# Patient Record
Sex: Female | Born: 1970
Health system: Southern US, Community
[De-identification: ages and names within clinical notes are randomized; demographics above are authoritative.]

## PROBLEM LIST (undated history)

## (undated) DIAGNOSIS — E079 Disorder of thyroid, unspecified: Secondary | ICD-10-CM

## (undated) DIAGNOSIS — F419 Anxiety disorder, unspecified: Secondary | ICD-10-CM

## (undated) DIAGNOSIS — R011 Cardiac murmur, unspecified: Secondary | ICD-10-CM

## (undated) HISTORY — DX: Anxiety disorder, unspecified: F41.9

## (undated) HISTORY — PX: EYE SURGERY: SHX253

## (undated) HISTORY — PX: TUBAL LIGATION: SHX77

## (undated) HISTORY — DX: Disorder of thyroid, unspecified: E07.9

## (undated) HISTORY — PX: WISDOM TOOTH EXTRACTION: SHX21

## (undated) HISTORY — DX: Cardiac murmur, unspecified: R01.1

---

## 2016-02-11 LAB — CBC AND DIFFERENTIAL
HCT: 38 (ref 36–46)
Hemoglobin: 12.9 (ref 12.0–16.0)
Neutrophils Absolute: 4238
PLATELETS: 363 (ref 150–399)
WBC: 6.5

## 2016-02-11 LAB — LIPID PANEL
Cholesterol: 171 (ref 0–200)
HDL: 63 (ref 35–70)
LDL Cholesterol: 93
TRIGLYCERIDES: 61 (ref 40–160)

## 2016-02-11 LAB — BASIC METABOLIC PANEL
BUN: 14 (ref 4–21)
CREATININE: 0.6 (ref 0.5–1.1)
Glucose: 75
POTASSIUM: 4.3 (ref 3.4–5.3)
Sodium: 138 (ref 137–147)

## 2016-02-11 LAB — TSH: TSH: 0.02 — AB (ref 0.41–5.90)

## 2016-02-11 LAB — HEPATIC FUNCTION PANEL
ALK PHOS: 64 (ref 25–125)
ALT: 10 (ref 7–35)
AST: 12 — AB (ref 13–35)
BILIRUBIN, TOTAL: 0.5

## 2016-02-11 LAB — FOLLICLE STIMULATING HORMONE: FSH: 7.9

## 2016-07-07 LAB — HEPATIC FUNCTION PANEL: BILIRUBIN, TOTAL: 0.5

## 2016-07-14 LAB — HEPATIC FUNCTION PANEL
ALK PHOS: 73 (ref 25–125)
ALT: 9 (ref 7–35)
AST: 13 (ref 13–35)

## 2016-07-14 LAB — BASIC METABOLIC PANEL
BUN: 10 (ref 4–21)
Creatinine: 0 — AB (ref <=1.1)
POTASSIUM: 3.9 (ref 3.4–5.3)
Sodium: 140 (ref 137–147)

## 2016-07-14 LAB — TSH: TSH: 0.2 — AB (ref <=5.90)

## 2016-07-14 LAB — CBC AND DIFFERENTIAL
HCT: 40 (ref 36–46)
HEMOGLOBIN: 13.2 (ref 12.0–16.0)
PLATELETS: 301 (ref 150–399)
WBC: 9.8

## 2016-08-03 LAB — LIPID PANEL
CHOLESTEROL: 211 — AB (ref 0–200)
HDL: 73 — AB (ref 35–70)
LDL CALC: 119
LDL/HDL RATIO: 1.8
TRIGLYCERIDES: 95 (ref 40–160)

## 2016-08-03 LAB — VITAMIN D 25 HYDROXY (VIT D DEFICIENCY, FRACTURES): Vit D, 25-Hydroxy: 88.8

## 2017-03-16 ENCOUNTER — Ambulatory Visit (INDEPENDENT_AMBULATORY_CARE_PROVIDER_SITE_OTHER): Payer: Self-pay | Admitting: Physician Assistant

## 2017-03-16 ENCOUNTER — Encounter: Payer: Self-pay | Admitting: Physician Assistant

## 2017-03-16 VITALS — BP 118/74 | HR 88 | Temp 98.5°F | Ht <= 58 in | Wt 106.2 lb

## 2017-03-16 DIAGNOSIS — J069 Acute upper respiratory infection, unspecified: Secondary | ICD-10-CM

## 2017-03-16 MED ORDER — PREDNISONE 20 MG PO TABS: 20.0000 mg | ORAL_TABLET | Freq: Two times a day (BID) | ORAL | 0 refills | Status: DC

## 2017-03-16 MED ORDER — DOXYCYCLINE HYCLATE 100 MG PO TABS
100.0000 mg | ORAL_TABLET | Freq: Two times a day (BID) | ORAL | 0 refills | Status: DC
Start: 2017-03-16 — End: 2017-06-22

## 2017-03-16 MED ORDER — PROMETHAZINE-CODEINE 6.25-10 MG/5ML PO SYRP: 5.0000 mL | ORAL_SOLUTION | Freq: Four times a day (QID) | ORAL | 0 refills | Status: DC | PRN

## 2017-03-16 MED ORDER — BENZONATATE 100 MG PO CAPS: 100.0000 mg | ORAL_CAPSULE | Freq: Two times a day (BID) | ORAL | 0 refills | Status: DC | PRN

## 2017-03-16 NOTE — Progress Notes (Signed)
Jessica Juarez is a 46 y.o. female here for a new problem.  History of Present Illness:   Chief Complaint  Patient presents with  . Establish Care  . Cough    x503mos  . congestion in chest    HPI   She reports that she has had a cough x 3 months. Was coughing and wheezing last night, and feels as though her symptoms are worsening. Has had a CXR in the past two years. Hx of walking PNA and bronchitis. Stress induced weight loss but no concerns. Does have increased gas and has had diarrhea since Sunday. Had subjective low-grade fever last night. Appetite has been poor over the past few days.  No recent travel, no lower leg pain, hx of blood clots. Has been drinking fluids well. Has been taking Mucinex and Robitussin without relief. 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 She notes that phenergan cough syrup and tessalon perles work well for her.  She also has a history of anxiety, depression, thyroid disease, heart murmur.   Past Medical History:  Diagnosis Date  . Anxiety   . Depression   . Heart murmur   . Thyroid disease      Social History   Social History  . Marital status: Married    Spouse name: N/A  . Number of children: N/A  . Years of education: N/A   Occupational History  . Not on file.   Social History Main Topics  . Smoking status: Never Smoker  . Smokeless tobacco: Never Used  . Alcohol use Yes  . Drug use: No  . Sexual activity: Yes   Other Topics Concern  . 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 CESAREAN SECTION    . EYE SURGERY    . TUBAL LIGATION    . WISDOM TOOTH EXTRACTION      Family History  Problem Relation Age of Onset  . Diabetes Mother   . Depression Mother   . Colon cancer Father   . Liver cancer Father   . Thyroid disease Brother   . Migraines Son   . Diabetes Maternal Grandmother   .  Colon cancer Paternal Grandfather     Allergies  Allergen Reactions  . Keflex [Cephalexin] Hives  . Azithromycin Palpitations    Current Medications:   Current Outpatient Prescriptions:  .  benzonatate (TESSALON) 100 MG capsule, Take 1 capsule (100 mg total) by mouth 2 (two) times daily as needed for cough., Disp: 20 capsule, Rfl: 0 .  doxycycline (VIBRA-TABS) 100 MG tablet, Take 1 tablet (100 mg total) by mouth 2 (two) times daily., Disp: 20 tablet, Rfl: 0 .  levothyroxine (SYNTHROID, LEVOTHROID) 75 MCG tablet, Take 75 mcg by mouth daily., Disp: , Rfl: 0 .  LORazepam (ATIVAN) 1 MG tablet, Take 1 tablet by mouth at bedtime., Disp: , Rfl: 0 .  predniSONE (DELTASONE) 20 MG tablet, Take 1 tablet (20 mg total) by mouth 2 (two) times daily with a meal., Disp: 10 tablet, Rfl: 0 .  promethazine-codeine (PHENERGAN WITH CODEINE) 6.25-10 MG/5ML syrup, Take 5 mLs by mouth every 6 (six) hours as needed for cough., Disp: 120 mL, Rfl: 0 .  sertraline (ZOLOFT) 50 MG tablet, Take 50 mg by mouth daily., Disp: , Rfl: 0   Review of Systems:  ROS  Negative unless otherwise specified per HPI.  Vitals:   Vitals:   03/16/17 1400  BP: 118/74  Pulse: 88  Temp: 98.5 F (36.9 C)  TempSrc: Oral  SpO2: 98%  Weight: 106 lb 4 oz (48.2 kg)  Height: 4' 9.75" (1.467 m)     Body mass index is 22.4 kg/m.  Physical Exam:   Physical Exam  Constitutional: She appears well-developed. She is cooperative.  Non-toxic appearance. She does not have a sickly appearance. She does not appear ill. No distress.  HENT:  Head: Normocephalic and atraumatic.  Right Ear: Tympanic membrane, external ear and ear canal normal. Tympanic membrane is not erythematous, not retracted and not bulging.  Left Ear: Tympanic membrane, external ear and ear canal normal. Tympanic membrane is not erythematous, not retracted and not bulging.  Nose: Mucosal edema and rhinorrhea present. Right sinus exhibits no maxillary sinus tenderness  and no frontal sinus tenderness. Left sinus exhibits no maxillary sinus tenderness and no frontal sinus tenderness.  Mouth/Throat: Uvula is midline. Posterior oropharyngeal erythema present. No posterior oropharyngeal edema. Tonsils are 2+ on the right. Tonsils are 2+ on the left. No tonsillar exudate.  Sinus tenderness endorsed at nasal bridge  Eyes: Conjunctivae and lids are normal.  Neck: Trachea normal.  Cardiovascular: Normal rate, regular rhythm, S1 normal, S2 normal and normal heart sounds.   Pulmonary/Chest: Effort normal and breath sounds normal. She has no decreased breath sounds. She has no wheezes. She has no rhonchi. She has no rales.  Good air movement throughout lung exam  Abdominal: Normal appearance and bowel sounds are normal. There is no tenderness. There is no rigidity, no rebound and no guarding.  Lymphadenopathy:    She has no cervical adenopathy.  Neurological: She is alert.  Skin: Skin is warm, dry and intact.  Psychiatric: She has a normal mood and affect. Her speech is normal and behavior is normal.  Nursing note and vitals reviewed.   Assessment and Plan:    Synthia was seen today for establish care, cough and congestion in chest.  Diagnoses and all orders for this visit:  Upper respiratory tract infection, unspecified type No red flags on exam. Patient is self-pay at this time, hoping to have insurance on Nov 1. Start daily antihistamine. Begin oral antibiotic and take prednisone with food. If symptoms worsen or persist, very low threshold to get CXR, she would like to defer today 2/2 insurance issues. Follow-up as needed.  Other orders -     doxycycline (VIBRA-TABS) 100 MG tablet; Take 1 tablet (100 mg total) by mouth 2 (two) times daily. -     predniSONE (DELTASONE) 20 MG tablet; Take 1 tablet (20 mg total) by mouth 2 (two) times daily with a meal. -     promethazine-codeine (PHENERGAN WITH CODEINE) 6.25-10 MG/5ML syrup; Take 5 mLs by mouth every 6 (six)  hours as needed for cough. -     benzonatate (TESSALON) 100 MG capsule; Take 1 capsule (100 mg total) by mouth 2 (two) times daily as needed for cough.    . Reviewed expectations re: course of current medical issues. . Discussed self-management of symptoms. . Outlined signs and symptoms indicating need for more acute intervention. . Patient verbalized understanding and all questions were answered. . See orders for this visit as documented in the electronic medical record. . Patient received an After-Visit Summary.  CMA or LPN served as scribe during this visit. History, Physical, and Plan performed by medical provider. Documentation and orders  reviewed and attested to.  Inda Coke, PA-C

## 2017-03-16 NOTE — Patient Instructions (Addendum)
It was great to see you!  Please start daily antihistamine (Zyrtec, Allegra, or Claritin) -- generic okay!  Push fluids.  Start oral antibiotic (doxycycline). Take 20 mg prednisone daily. Cough syrup as needed. Tessalon perles as needed. If cough medicines are too expensive at pharmacy, I recommend Delsym.  Follow-up with us if symptoms worsen or persist.

## 2017-06-06 ENCOUNTER — Telehealth: Payer: Self-pay | Admitting: Physician Assistant

## 2017-06-06 ENCOUNTER — Ambulatory Visit (INDEPENDENT_AMBULATORY_CARE_PROVIDER_SITE_OTHER): Payer: BLUE CROSS/BLUE SHIELD

## 2017-06-06 DIAGNOSIS — Z23 Encounter for immunization: Secondary | ICD-10-CM | POA: Diagnosis not present

## 2017-06-06 NOTE — Telephone Encounter (Signed)
Please contact patient to schedule a follow up appointment in order to get medication.

## 2017-06-06 NOTE — Progress Notes (Signed)
I have reviewed and agree with note, evaluation, plan.   Stephen Hunter, MD  

## 2017-06-06 NOTE — Telephone Encounter (Signed)
Patient would like to have blood work done before her appointment with Rinaldo CloudSam Worley in March. She also needs two refills, listed below. Patient would like to know if her lorazepam is given in a 30 day supply, or more or less, because it's a controlled substance. Please advise.   MEDICATION: Levothyroxine (Synthroid, Levothroid) 75 MCG tablet   [facility] [address] [place]   IS THIS A 90 DAY SUPPLY : allowed to have 90, but has been getting it in 30 day supplies because of a recent move  IS PATIENT OUT OF MEDICATION: no  IF NOT; HOW MUCH IS LEFT: enough to last for all of January  LAST APPOINTMENT DATE: @1 /01/2018  NEXT APPOINTMENT DATE:@3 /04/2018  OTHER COMMENTS:    **Let patient know to contact pharmacy at the end of the day to make sure medication is ready. **  ** Please notify patient to allow 48-72 hours to process**  **Encourage patient to contact the pharmacy for refills or they can request refills through [facility]**   MEDICATION: lorazepam (ativan) 1MG  tablet  [facility] [address], [place]  IS THIS A 90 DAY SUPPLY : no, 30 day  IS PATIENT OUT OF MEDICATION: no  IF NOT; HOW MUCH IS LEFT: enough for all of January  LAST APPOINTMENT DATE: @1 /01/2018  NEXT APPOINTMENT DATE:@3 /04/2018  OTHER COMMENTS:    **Let patient know to contact pharmacy at the end of the day to make sure medication is ready. **  ** Please notify patient to allow 48-72 hours to process**  **Encourage patient to contact the pharmacy for refills or they can [facility] through [facility]**

## 2017-06-06 NOTE — Telephone Encounter (Signed)
Please call pt and schedule an appointment to see another provider until Jessica Juarez is back for refills on medications and lab work. Jessica Juarez has never prescribed these medications for pt and there is no labs.

## 2017-06-06 NOTE — Progress Notes (Signed)
Fluvarix Quadrivalent, 0.5 mL given IM, left deltoid, mfg: [employer], lot#: [id number], exp: 11/25/2017, ndc: 14782-956-21: 58160-898-41, pt tolerated well.

## 2017-06-20 ENCOUNTER — Encounter: Payer: BLUE CROSS/BLUE SHIELD | Admitting: Family Medicine

## 2017-06-22 ENCOUNTER — Ambulatory Visit: Payer: BLUE CROSS/BLUE SHIELD | Admitting: Family Medicine

## 2017-06-22 ENCOUNTER — Encounter: Payer: Self-pay | Admitting: Family Medicine

## 2017-06-22 VITALS — BP 112/74 | HR 71 | Temp 98.3°F | Ht <= 58 in | Wt 107.6 lb

## 2017-06-22 DIAGNOSIS — E039 Hypothyroidism, unspecified: Secondary | ICD-10-CM

## 2017-06-22 DIAGNOSIS — F5102 Adjustment insomnia: Secondary | ICD-10-CM

## 2017-06-22 LAB — TSH: TSH: 0.06 u[IU]/mL — ABNORMAL LOW (ref 0.35–4.50)

## 2017-06-22 LAB — T4, FREE: Free T4: 1.25 ng/dL (ref 0.60–1.60)

## 2017-06-22 MED ORDER — LORAZEPAM 1 MG PO TABS: 1.0000 mg | ORAL_TABLET | Freq: Every day | ORAL | 0 refills | Status: DC

## 2017-06-22 NOTE — Progress Notes (Signed)
Jessica Juarez is a 47 y.o. female is here for follow up.  History of Present Illness:   Britt Bottom CMA acting as scribe for Dr. Earlene Plater.  HPI: Patient comes in today for a refill on her levothyroxine and ativan. She has not had thyroid labs done since June 2018. We will order today. Patient is tolerating meditations.   Health Maintenance Due  Topic Date Due  . HIV Screening  09/09/1983  . TETANUS/TDAP  09/09/1987  . PAP SMEAR  09/08/1989   No flowsheet data found. PMHx, SurgHx, SocialHx, FamHx, Medications, and Allergies were reviewed in the Visit Navigator and updated as appropriate.  There are no active problems to display for this patient.  Social History   Tobacco Use  . Smoking status: Never Smoker  . Smokeless tobacco: Never Used  Substance Use Topics  . Alcohol use: Yes  . Drug use: No   Current Medications and Allergies:   Current Outpatient Medications:  .  levothyroxine (SYNTHROID, LEVOTHROID) 75 MCG tablet, Take 75 mcg by mouth daily., Disp: , Rfl: 0 .  LORazepam (ATIVAN) 1 MG tablet, Take 1 tablet by mouth at bedtime., Disp: , Rfl: 0  Allergies  Allergen Reactions  . Keflex [Cephalexin] Hives  . Azithromycin Palpitations   Review of Systems   Pertinent items are noted in the HPI. Otherwise, ROS is negative.  Vitals:   Vitals:   06/22/17 0959  Pulse: 71  Temp: 98.3 F (36.8 C)  TempSrc: Oral  SpO2: 100%  Weight: 107 lb 9.6 oz (48.8 kg)  Height: 4' 9.75" (1.467 m)     Body mass index is 22.68 kg/m.  Physical Exam:   Physical Exam  Constitutional: She is oriented to person, place, and time. She appears well-developed and well-nourished. No distress.  HENT:  Head: Normocephalic and atraumatic.  Right Ear: External ear normal.  Left Ear: External ear normal.  Nose: Nose normal.  Mouth/Throat: Oropharynx is clear and moist.  Eyes: Conjunctivae and EOM are normal. Pupils are equal, round, and reactive to light.  Neck: Normal range of  motion. Neck supple. No thyromegaly present.  Cardiovascular: Normal rate, regular rhythm, normal heart sounds and intact distal pulses.  Pulmonary/Chest: Effort normal and breath sounds normal.  Abdominal: Soft. Bowel sounds are normal.  Musculoskeletal: Normal range of motion.  Lymphadenopathy:    She has no cervical adenopathy.  Neurological: She is alert and oriented to person, place, and time.  Skin: Skin is warm and dry. Capillary refill takes less than 2 seconds.  Psychiatric: She has a normal mood and affect. Her behavior is normal.  Nursing note and vitals reviewed.   Assessment and Plan:   1. Adjustment insomnia Patient has been on this medication for years. Database reviewed and without concern. Okay three month fill today, but let her know that her primary may not feel that this is the best regimen to continue in the future. She has not tried other medications. After discussion, she may try to decrease this medication to 1/2 over the next few months, with the intent to wean off.  - LORazepam (ATIVAN) 1 MG tablet; Take 1 tablet (1 mg total) by mouth at bedtime.  Dispense: 90 tablet; Refill: 0  2. Acquired hypothyroidism Endocrine ROS: negative for - breast changes, hair pattern changes, hot flashes, malaise/lethargy, mood swings, palpitations, skin changes, temperature intolerance or unexpected weight changes. Will check labs prior to sending in refill.  - TSH - T4, free   . Reviewed expectations re:  course of current medical issues. . Discussed self-management of symptoms. . Outlined signs and symptoms indicating need for more acute intervention. . Patient verbalized understanding and all questions were answered. Marland Kitchen. Health Maintenance issues including appropriate healthy diet, exercise, and smoking avoidance were discussed with patient. . See orders for this visit as documented in the electronic medical record. . Patient received an After Visit Summary.  CMA served as  Neurosurgeonscribe during this visit. History, Physical, and Plan performed by medical provider. The above documentation has been reviewed and is accurate and complete. Helane RimaErica Jaidyn Kuhl, D.O.   Helane RimaErica Humbert Morozov, DO [place] 06/23/2017

## 2017-06-25 MED ORDER — LEVOTHYROXINE SODIUM 50 MCG PO TABS
50.0000 ug | ORAL_TABLET | Freq: Every day | ORAL | 3 refills | Status: DC
Start: 2017-06-25 — End: 2018-07-02

## 2017-06-25 NOTE — Addendum Note (Signed)
Addended by: Helane RimaWALLACE, Marrion Finan R on: 06/25/2017 02:43 PM   Modules accepted: Orders

## 2017-08-07 ENCOUNTER — Encounter: Payer: Self-pay | Admitting: Physician Assistant

## 2017-08-07 ENCOUNTER — Encounter: Payer: BLUE CROSS/BLUE SHIELD | Admitting: Physician Assistant

## 2017-08-07 ENCOUNTER — Other Ambulatory Visit: Payer: Self-pay | Admitting: Physician Assistant

## 2017-08-07 ENCOUNTER — Telehealth: Payer: Self-pay | Admitting: Physician Assistant

## 2017-08-07 ENCOUNTER — Ambulatory Visit (INDEPENDENT_AMBULATORY_CARE_PROVIDER_SITE_OTHER): Payer: BLUE CROSS/BLUE SHIELD | Admitting: Physician Assistant

## 2017-08-07 VITALS — BP 110/70 | HR 79 | Temp 98.7°F | Ht <= 58 in | Wt 105.4 lb

## 2017-08-07 DIAGNOSIS — Z13220 Encounter for screening for lipoid disorders: Secondary | ICD-10-CM | POA: Diagnosis not present

## 2017-08-07 DIAGNOSIS — K5900 Constipation, unspecified: Secondary | ICD-10-CM | POA: Diagnosis not present

## 2017-08-07 DIAGNOSIS — E559 Vitamin D deficiency, unspecified: Secondary | ICD-10-CM | POA: Diagnosis not present

## 2017-08-07 DIAGNOSIS — F4321 Adjustment disorder with depressed mood: Secondary | ICD-10-CM | POA: Diagnosis not present

## 2017-08-07 DIAGNOSIS — M542 Cervicalgia: Secondary | ICD-10-CM | POA: Diagnosis not present

## 2017-08-07 DIAGNOSIS — F419 Anxiety disorder, unspecified: Secondary | ICD-10-CM | POA: Diagnosis not present

## 2017-08-07 DIAGNOSIS — N926 Irregular menstruation, unspecified: Secondary | ICD-10-CM

## 2017-08-07 DIAGNOSIS — B001 Herpesviral vesicular dermatitis: Secondary | ICD-10-CM | POA: Diagnosis not present

## 2017-08-07 DIAGNOSIS — E039 Hypothyroidism, unspecified: Secondary | ICD-10-CM

## 2017-08-07 DIAGNOSIS — Z0001 Encounter for general adult medical examination with abnormal findings: Secondary | ICD-10-CM | POA: Diagnosis not present

## 2017-08-07 LAB — CBC WITH DIFFERENTIAL/PLATELET
Basophils Absolute: 0 10*3/uL (ref 0.0–0.1)
Basophils Relative: 0.7 % (ref 0.0–3.0)
EOS PCT: 0.6 % (ref 0.0–5.0)
Eosinophils Absolute: 0 10*3/uL (ref 0.0–0.7)
HEMATOCRIT: 39.5 % (ref 36.0–46.0)
HEMOGLOBIN: 13.3 g/dL (ref 12.0–15.0)
LYMPHS PCT: 32.9 % (ref 12.0–46.0)
Lymphs Abs: 1.5 10*3/uL (ref 0.7–4.0)
MCHC: 33.8 g/dL (ref 30.0–36.0)
MCV: 90.4 fl (ref 78.0–100.0)
MONO ABS: 0.3 10*3/uL (ref 0.1–1.0)
MONOS PCT: 6.8 % (ref 3.0–12.0)
Neutro Abs: 2.7 10*3/uL (ref 1.4–7.7)
Neutrophils Relative %: 59 % (ref 43.0–77.0)
Platelets: 362 10*3/uL (ref 150.0–400.0)
RBC: 4.36 Mil/uL (ref 3.87–5.11)
RDW: 13.1 % (ref 11.5–15.5)
WBC: 4.6 10*3/uL (ref 4.0–10.5)

## 2017-08-07 LAB — LIPID PANEL
CHOL/HDL RATIO: 3
Cholesterol: 177 mg/dL (ref 0–200)
HDL: 63.5 mg/dL (ref >39.00)
LDL CALC: 99 mg/dL (ref 0–99)
NonHDL: 113.95
Triglycerides: 74 mg/dL (ref 0.0–149.0)
VLDL: 14.8 mg/dL (ref 0.0–40.0)

## 2017-08-07 LAB — VITAMIN D 25 HYDROXY (VIT D DEFICIENCY, FRACTURES): VITD: 32.83 ng/mL (ref 30.00–100.00)

## 2017-08-07 LAB — COMPREHENSIVE METABOLIC PANEL
ALBUMIN: 4.6 g/dL (ref 3.5–5.2)
ALT: 11 U/L (ref 0–35)
AST: 10 U/L (ref 0–37)
Alkaline Phosphatase: 55 U/L (ref 39–117)
BUN: 14 mg/dL (ref 6–23)
CHLORIDE: 103 meq/L (ref 96–112)
CO2: 29 meq/L (ref 19–32)
Calcium: 10.2 mg/dL (ref 8.4–10.5)
Creatinine, Ser: 0.65 mg/dL (ref 0.40–1.20)
GFR: 103.01 mL/min (ref >60.00)
Glucose, Bld: 89 mg/dL (ref 70–99)
POTASSIUM: 4.1 meq/L (ref 3.5–5.1)
SODIUM: 140 meq/L (ref 135–145)
Total Bilirubin: 0.6 mg/dL (ref 0.2–1.2)
Total Protein: 7.2 g/dL (ref 6.0–8.3)

## 2017-08-07 LAB — LUTEINIZING HORMONE: LH: 63.37 m[IU]/mL

## 2017-08-07 LAB — T4, FREE: FREE T4: 0.9 ng/dL (ref 0.60–1.60)

## 2017-08-07 LAB — FOLLICLE STIMULATING HORMONE: FSH: 115.7 m[IU]/mL

## 2017-08-07 LAB — TSH: TSH: 1.12 u[IU]/mL (ref 0.35–4.50)

## 2017-08-07 MED ORDER — CYCLOBENZAPRINE HCL 5 MG PO TABS: 2.5000 mg | ORAL_TABLET | Freq: Every day | ORAL | 0 refills | Status: DC

## 2017-08-07 MED ORDER — VALACYCLOVIR HCL 1 G PO TABS: ORAL_TABLET | ORAL | 2 refills | Status: DC

## 2017-08-07 NOTE — Assessment & Plan Note (Signed)
I'm going to recheck her labs today. Of note she recently was decreased from 75 g to 50 g in January. She is having some symptoms including constipation, depression, dry skin. Will adjust dosage based on lab results.

## 2017-08-07 NOTE — Assessment & Plan Note (Signed)
Recheck today. 

## 2017-08-07 NOTE — Telephone Encounter (Signed)
Copied from CRM [phone]. Topic: Inquiry >> Aug 07, 2017 11:13 AM Maia Pettiesrtiz, Kristie S wrote: Reason for CRM: pt called in requesting flexeril as discussed with provider. She said she may only take it on the weekends. Please call to notify pt. [id number] [address]- [place], [place] - [address] [phone] (Phone) [phone] (Fax)

## 2017-08-07 NOTE — Telephone Encounter (Signed)
Sent!

## 2017-08-07 NOTE — Assessment & Plan Note (Signed)
GAD-7 is 12 today. A lot of her anxiety is from situational stress from her recent move. She is reluctant to restart Zoloft. Continue Ativan when necessary. She is agreeable to reaching out to Erasmo DownerLisa Florez, our Child psychotherapistsocial worker, for counseling. I would like for follow-up in 2 weeks.

## 2017-08-07 NOTE — Assessment & Plan Note (Signed)
Uncontrolled. He is getting worse with time. She would like to go ahead and establish with gastroenterology as she is anticipating needing a colonoscopy soon, and would like to possibly discuss prescription medications for her constipation.

## 2017-08-07 NOTE — Assessment & Plan Note (Signed)
Currently asymptomatic. I have sent in Valtrex for her to take as needed for potential future outbreaks.

## 2017-08-07 NOTE — Addendum Note (Signed)
Addended by: Haynes BastWORLEY, Deven Furia J on: 08/07/2017 12:09 PM   Modules accepted: Orders

## 2017-08-07 NOTE — Telephone Encounter (Signed)
See note

## 2017-08-07 NOTE — Telephone Encounter (Signed)
Please see message, pt wanting Flexeril as discussed at visit.

## 2017-08-07 NOTE — Progress Notes (Addendum)
I acted as a Neurosurgeon for [employer], PA-C [employer], LPN   Subjective:    Jessica Juarez is a 47 y.o. female and is here for a comprehensive physical exam.   HPI  Health Maintenance Due  Topic Date Due  . HIV Screening  09/09/1983  . MAMMOGRAM  09/09/1986  . PAP SMEAR  09/08/1989    Acute Concerns: Anxiety and situational depression -- she recently moved here from [place] in the fall, and has had difficulty adjusting. She had a job set up when she moved here, but it fell through. 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 Did struggle with depression in the past, was taking Zoloft after her father diet. She currently does not want to restart it, she does not like taking medication every day. She currently is taking Ativan to help her sleep. She still having difficulty sleeping even with using this medication. She does not like living in an apartment. She mentions possible marriage troubles. She does not have a support group here. Irregular periods -- two episodes of spotting in Dec 2018, none since. 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 She is wondering if this is due to stress or if she is nearing menopause. Neck pain -- patient reports that her right neck has been very stiff lately due to her sleeping position and stress. She feels like she's been caring all of her weight in her shoulder. She has tried self-massage and ibuprofen without relief.  Chronic Issues: Hypothyroidism -- she was on 75 g Synthroid daily, but this was decreased to 50 g daily after visit in January with Dr. Helane Rima. She reports that she takes this medication most days of the week and always on an empty stomach. She does endorse depression, dry skin, and chronic constipation. Vit D deficiency -- she has a history of this, and it has not been checked in sometime. She would like to see what her levels are today. She takes supplements  daily. Constipation -- takes miralax TID, and has for quite some time. She has never seen a gastroenterologist. Father died of colon cancer, reports that he was around 31 when he died and when they found his colon cancer stage IV. She does not know what age she was diagnosed. Herpes simplex -- she will take Valtrex as needed when she has outbreaks. Has not had any outbreaks recently since moving down here. She would like to have Valtrex on hand in case she needs it.  Health Maintenance: Immunizations -- UTD Colonoscopy -- has not been performed Mammogram -- UTD, requesting records PAP -- UTD, requesting records Bone Density -- n/a Diet -- decreased appetite, will sometimes only eat 1 meal a day Caffeine intake -- coffee x 1 Sleep habits -- poor sleep Exercise -- limited Weight -- Weight: 105 lb 6.1 oz (47.8 kg)   Depression screen PHQ 2/9 08/07/2017  Decreased Interest 1  Down, Depressed, Hopeless 1  PHQ - 2 Score 2  Altered sleeping 1  Tired, decreased energy 2  Change in appetite 2  Feeling bad or failure about yourself  1  Trouble concentrating 1  Moving slowly or fidgety/restless 1  Suicidal thoughts 0  PHQ-9 Score 10  Difficult doing work/chores Somewhat difficult   GAD 7 : Generalized Anxiety Score 08/07/2017  Nervous, Anxious, on Edge 3  Control/stop worrying 2  Worry too much - different things 2  Trouble relaxing  2  Restless 1  Easily annoyed or irritable 1  Afraid - awful might happen 1  Total GAD 7 Score 12     Other providers/specialists: None   PMHx, SurgHx, SocialHx, Medications, and Allergies were reviewed in the Visit Navigator and updated as appropriate.   Past Medical History:  Diagnosis Date  . Anxiety   . Heart murmur      Past Surgical History:  Procedure Laterality Date  . CESAREAN SECTION    . EYE SURGERY    . TUBAL LIGATION    . WISDOM TOOTH EXTRACTION       Family History  Problem Relation Age of Onset  . Diabetes Mother   .  Depression Mother   . Colon cancer Father        36  . Liver cancer Father   . Thyroid disease Brother   . Migraines Son   . Diabetes Maternal Grandmother   . Colon cancer Paternal Grandfather     Social History   Tobacco Use  . Smoking status: Never Smoker  . Smokeless tobacco: Never Used  Substance Use Topics  . Alcohol use: Yes  . Drug use: No    Review of Systems:   Review of Systems  Constitutional: Negative.  Negative for chills, fever, malaise/fatigue and weight loss.  HENT: Negative for hearing loss, sinus pain and sore throat.   Eyes: Negative.  Negative for blurred vision.  Respiratory: Negative.  Negative for cough and shortness of breath.   Cardiovascular: Negative.  Negative for chest pain, palpitations and leg swelling.  Gastrointestinal: Positive for constipation. Negative for abdominal pain, diarrhea, heartburn, nausea and vomiting.  Genitourinary: Negative.  Negative for dysuria, frequency and urgency.  Musculoskeletal: Negative for back pain, myalgias and neck pain.  Skin: Negative.  Negative for itching and rash.  Neurological: Negative.  Negative for dizziness, tingling, seizures, loss of consciousness and headaches.  Endo/Heme/Allergies: Negative.  Negative for polydipsia.  Psychiatric/Behavioral: Negative for depression. The patient is nervous/anxious.     Objective:   BP 110/70 (BP Location: Left Arm, Patient Position: Sitting, Cuff Size: Normal)   Pulse 79   Temp 98.7 F (37.1 C) (Oral)   Ht 4\' 10"  (1.473 m)   Wt 105 lb 6.1 oz (47.8 kg)   LMP 05/25/2017   SpO2 99%   BMI 22.02 kg/m  Body mass index is 22.02 kg/m.   General Appearance:    Alert, cooperative, no distress, appears stated age, tearful throughout exam today  Head:    Normocephalic, without obvious abnormality, atraumatic  Eyes:    PERRL, conjunctiva/corneas clear, EOM's intact, fundi    benign, both eyes  Ears:    Normal TM's and external ear canals, both ears  Nose:   Nares  normal, septum midline, mucosa normal, no drainage    or sinus tenderness  Throat:   Lips, mucosa, and tongue normal; teeth and gums normal  Neck:   Supple, symmetrical, trachea midline, no adenopathy;    thyroid:  no enlargement/tenderness/nodules; no carotid   bruit or JVD  Back:     Symmetric, no curvature, ROM normal, no CVA tenderness  Lungs:     Clear to auscultation bilaterally, respirations unlabored  Chest Wall:    No tenderness or deformity   Heart:    Regular rate and rhythm, S1 and S2 normal, no murmur, rub   or gallop  Breast Exam:    Deferred  Abdomen:     Soft, non-tender, bowel sounds active all four  quadrants,    no masses, no organomegaly  Genitalia:    Deferred  Extremities:   Extremities normal, atraumatic, no cyanosis or edema; tenderness with deep palpation to right trapezius muscle. No bony tenderness. No signs of erythema or rash.   Pulses:   2+ and symmetric all extremities  Skin:   Skin color, texture, turgor normal, no rashes or lesions  Lymph nodes:   Cervical, supraclavicular, and axillary nodes normal  Neurologic:   CNII-XII intact, normal strength, sensation and reflexes    throughout    Assessment/Plan:   Problem List Items Addressed This Visit      Digestive   Herpes simplex labialis    Currently asymptomatic. I have sent in Valtrex for her to take as needed for potential future outbreaks.      Relevant Medications   valACYclovir (VALTREX) 1000 MG tablet     Endocrine   Hypothyroidism    I'm going to recheck her labs today. Of note she recently was decreased from 75 g to 50 g in January. She is having some symptoms including constipation, depression, dry skin. Will adjust dosage based on lab results.      Relevant Orders   TSH   T4, free     Other   Vitamin D deficiency    Recheck today.      Relevant Orders   VITAMIN D 25 Hydroxy (Vit-D Deficiency, Fractures)   Constipation    Uncontrolled. He is getting worse with time. She would  like to go ahead and establish with gastroenterology as she is anticipating needing a colonoscopy soon, and would like to possibly discuss prescription medications for her constipation.      Relevant Orders   TSH   T4, free   Ambulatory referral to Gastroenterology   Anxiety    GAD-7 is 12 today. A lot of her anxiety is from situational stress from her recent move. She is reluctant to restart Zoloft. Continue Ativan when necessary. She is agreeable to reaching out to Erasmo Downer, our Child psychotherapist, for counseling. I would like for follow-up in 2 weeks.      Relevant Orders   CBC w/Diff   Comprehensive metabolic panel   Situational depression    PH 29 is 10 today. She denies suicidal thoughts today. She is agreeable to meeting with Colen Darling, our social worker, for cough therapy. She is going to consider restarting Zoloft, however she is reluctant at this time. I want her to follow-up with me in 2 weeks. I discussed with patient that if they develop any SI, to tell someone immediately and seek medical attention.       Relevant Orders   CBC w/Diff   Comprehensive metabolic panel    Other Visit Diagnoses    Encounter for general adult medical examination with abnormal findings    -  Primary Today patient counseled on age appropriate routine health concerns for screening and prevention, each reviewed and up to date or declined. Immunizations reviewed and up to date or declined. Labs ordered and reviewed. Risk factors for depression reviewed and negative. Hearing function and visual acuity are intact. ADLs screened and addressed as needed. Functional ability and level of safety reviewed and appropriate. Education, counseling and referrals performed based on assessed risks today. Patient provided with a copy of personalized plan for preventive services.   Irregular periods     Check LH and FSH. Will also recheck thyroid levels. Question if her anxiety and depression is playing a role  with  irregular periods as well. She is requesting a referral to OB/GYN.    Relevant Orders   TSH   T4, free   FSH   LH   Encounter for screening for lipid disorder       Relevant Orders   Lipid Profile   Neck pain     I suspect that she has a trapezius muscle strain. I would like for her to try to consider dry needling. She may also follow with Dr. Gaspar BiddingMichael Rigby if symptoms worsen. We briefly discussed using Flexeril, she is contemplating if she wants this. I told her that if we do start that this, that she cannot take Ativan with it.       Well Adult Exam: Labs ordered: Yes. Patient counseling was done. See below for items discussed. Discussed the patient's BMI.  The BMI BMI is in the acceptable range Follow up in 2 weeks. Breast cancer screening: UTD. Cervical cancer screening: UTD   Patient Counseling: [x]    Nutrition: Stressed importance of moderation in sodium/caffeine intake, saturated fat and cholesterol, caloric balance, sufficient intake of fresh fruits, vegetables, fiber, calcium, iron, and 1 mg of folate supplement per day (for females capable of pregnancy).  [x]    Stressed the importance of regular exercise.   [x]    Substance Abuse: Discussed cessation/primary prevention of tobacco, alcohol, or other drug use; driving or other dangerous activities under the influence; availability of treatment for abuse.   [x]    Injury prevention: Discussed safety belts, safety helmets, smoke detector, smoking near bedding or upholstery.   [x]    Sexuality: Discussed sexually transmitted diseases, partner selection, use of condoms, avoidance of unintended pregnancy  and contraceptive alternatives.  [x]    Dental health: Discussed importance of regular tooth brushing, flossing, and dental visits.  [x]    Health maintenance and immunizations reviewed. Please refer to Health maintenance section.   CMA or LPN served as scribe during this visit. History, Physical, and Plan performed by medical provider.  Documentation and orders reviewed and attested to.  Jarold MottoSamantha Michele Kerlin, PA-C [facility]

## 2017-08-07 NOTE — Patient Instructions (Addendum)
It was great to see you!  Please call Trey Paula our therapist, for an appointment: [phone].  Please consider getting dry needling done for your neck pain. Our physical therapist does this on Fridays. Let me know and I can put this referral in so you can be seen. We can also consider the muscle relaxer, Flexeril, let me know your thoughts.  Follow-up with me in 2 weeks, sooner if needed. If you develop any worsening or suicidal thoughts, please go to the emergency room.  Health Maintenance, Female Adopting a healthy lifestyle and getting preventive care can go a long way to promote health and wellness. Talk with your health care provider about what schedule of regular examinations is right for you. This is a good chance for you to check in with your provider about disease prevention and staying healthy. In between checkups, there are plenty of things you can do on your own. Experts have done a lot of research about which lifestyle changes and preventive measures are most likely to keep you healthy. Ask your health care provider for more information. Weight and diet Eat a healthy diet  Be sure to include plenty of vegetables, fruits, low-fat dairy products, and lean protein.  Do not eat a lot of foods high in solid fats, added sugars, or salt.  Get regular exercise. This is one of the most important things you can do for your health. ? Most adults should exercise for at least 150 minutes each week. The exercise should increase your heart rate and make you sweat (moderate-intensity exercise). ? Most adults should also do strengthening exercises at least twice a week. This is in addition to the moderate-intensity exercise.  Maintain a healthy weight  Body mass index (BMI) is a measurement that can be used to identify possible weight problems. It estimates body fat based on height and weight. Your health care provider can help determine your BMI and help you achieve or maintain a healthy  weight.  For females 31 years of age and older: ? A BMI below 18.5 is considered underweight. ? A BMI of 18.5 to 24.9 is normal. ? A BMI of 25 to 29.9 is considered overweight. ? A BMI of 30 and above is considered obese.  Watch levels of cholesterol and blood lipids  You should start having your blood tested for lipids and cholesterol at 49 years of age, then have this test every 5 years.  You may need to have your cholesterol levels checked more often if: ? Your lipid or cholesterol levels are high. ? You are older than 49 years of age. ? You are at high risk for heart disease.  Cancer screening Lung Cancer  Lung cancer screening is recommended for adults 61-60 years old who are at high risk for lung cancer because of a history of smoking.  A yearly low-dose CT scan of the lungs is recommended for people who: ? Currently smoke. ? Have quit within the past 15 years. ? Have at least a 30-pack-year history of smoking. A pack year is smoking an average of one pack of cigarettes a day for 1 year.  Yearly screening should continue until it has been 15 years since you quit.  Yearly screening should stop if you develop a health problem that would prevent you from having lung cancer treatment.  Breast Cancer  Practice breast self-awareness. This means understanding how your breasts normally appear and feel.  It also means doing regular breast self-exams. Let your health care  provider know about any changes, no matter how small.  If you are in your 20s or 30s, you should have a clinical breast exam (CBE) by a health care provider every 1-3 years as part of a regular health exam.  If you are 44 or older, have a CBE every year. Also consider having a breast X-ray (mammogram) every year.  If you have a family history of breast cancer, talk to your health care provider about genetic screening.  If you are at high risk for breast cancer, talk to your health care provider about having an  MRI and a mammogram every year.  Breast cancer gene (BRCA) assessment is recommended for women who have family members with BRCA-related cancers. BRCA-related cancers include: ? Breast. ? Ovarian. ? Tubal. ? Peritoneal cancers.  Results of the assessment will determine the need for genetic counseling and BRCA1 and BRCA2 testing.  Cervical Cancer Your health care provider may recommend that you be screened regularly for cancer of the pelvic organs (ovaries, uterus, and vagina). This screening involves a pelvic examination, including checking for microscopic changes to the surface of your cervix (Pap test). You may be encouraged to have this screening done every 3 years, beginning at age 39.  For women ages 55-65, health care providers may recommend pelvic exams and Pap testing every 3 years, or they may recommend the Pap and pelvic exam, combined with testing for human papilloma virus (HPV), every 5 years. Some types of HPV increase your risk of cervical cancer. Testing for HPV may also be done on women of any age with unclear Pap test results.  Other health care providers may not recommend any screening for nonpregnant women who are considered low risk for pelvic cancer and who do not have symptoms. Ask your health care provider if a screening pelvic exam is right for you.  If you have had past treatment for cervical cancer or a condition that could lead to cancer, you need Pap tests and screening for cancer for at least 20 years after your treatment. If Pap tests have been discontinued, your risk factors (such as having a new sexual partner) need to be reassessed to determine if screening should resume. Some women have medical problems that increase the chance of getting cervical cancer. In these cases, your health care provider may recommend more frequent screening and Pap tests.  Colorectal Cancer  This type of cancer can be detected and often prevented.  Routine colorectal cancer screening  usually begins at 49 years of age and continues through 49 years of age.  Your health care provider may recommend screening at an earlier age if you have risk factors for colon cancer.  Your health care provider may also recommend using home test kits to check for hidden blood in the stool.  A small camera at the end of a tube can be used to examine your colon directly (sigmoidoscopy or colonoscopy). This is done to check for the earliest forms of colorectal cancer.  Routine screening usually begins at age 35.  Direct examination of the colon should be repeated every 5-10 years through 49 years of age. However, you may need to be screened more often if early forms of precancerous polyps or small growths are found.  Skin Cancer  Check your skin from head to toe regularly.  Tell your health care provider about any new moles or changes in moles, especially if there is a change in a mole's shape or color.  Also tell your  health care provider if you have a mole that is larger than the size of a pencil eraser.  Always use sunscreen. Apply sunscreen liberally and repeatedly throughout the day.  Protect yourself by wearing long sleeves, pants, a wide-brimmed hat, and sunglasses whenever you are outside.  Heart disease, diabetes, and high blood pressure  High blood pressure causes heart disease and increases the risk of stroke. High blood pressure is more likely to develop in: ? People who have blood pressure in the high end of the normal range (130-139/85-89 mm Hg). ? People who are overweight or obese. ? People who are African American.  If you are 6-77 years of age, have your blood pressure checked every 3-5 years. If you are 14 years of age or older, have your blood pressure checked every year. You should have your blood pressure measured twice-once when you are at a hospital or clinic, and once when you are not at a hospital or clinic. Record the average of the two measurements. To check  your blood pressure when you are not at a hospital or clinic, you can use: ? An automated blood pressure machine at a pharmacy. ? A home blood pressure monitor.  If you are between 35 years and 30 years old, ask your health care provider if you should take aspirin to prevent strokes.  Have regular diabetes screenings. This involves taking a blood sample to check your fasting blood sugar level. ? If you are at a normal weight and have a low risk for diabetes, have this test once every three years after 49 years of age. ? If you are overweight and have a high risk for diabetes, consider being tested at a younger age or more often. Preventing infection Hepatitis B  If you have a higher risk for hepatitis B, you should be screened for this virus. You are considered at high risk for hepatitis B if: ? You were born in a country where hepatitis B is common. Ask your health care provider which countries are considered high risk. ? Your parents were born in a high-risk country, and you have not been immunized against hepatitis B (hepatitis B vaccine). ? You have HIV or AIDS. ? You use needles to inject street drugs. ? You live with someone who has hepatitis B. ? You have had sex with someone who has hepatitis B. ? You get hemodialysis treatment. ? You take certain medicines for conditions, including cancer, organ transplantation, and autoimmune conditions.  Hepatitis C  Blood testing is recommended for: ? Everyone born from [birth year] through 1967. ? Anyone with known risk factors for hepatitis C.  Sexually transmitted infections (STIs)  You should be screened for sexually transmitted infections (STIs) including gonorrhea and chlamydia if: ? You are sexually active and are younger than 49 years of age. ? You are older than 49 years of age and your health care provider tells you that you are at risk for this type of infection. ? Your sexual activity has changed since you were last screened and you  are at an increased risk for chlamydia or gonorrhea. Ask your health care provider if you are at risk.  If you do not have HIV, but are at risk, it may be recommended that you take a prescription medicine daily to prevent HIV infection. This is called pre-exposure prophylaxis (PrEP). You are considered at risk if: ? You are sexually active and do not regularly use condoms or know the HIV status of your partner(s). ?  You take drugs by injection. ? You are sexually active with a partner who has HIV.  Talk with your health care provider about whether you are at high risk of being infected with HIV. If you choose to begin PrEP, you should first be tested for HIV. You should then be tested every 3 months for as long as you are taking PrEP. Pregnancy  If you are premenopausal and you may become pregnant, ask your health care provider about preconception counseling.  If you may become pregnant, take 400 to 800 micrograms (mcg) of folic acid every day.  If you want to prevent pregnancy, talk to your health care provider about birth control (contraception). Osteoporosis and menopause  Osteoporosis is a disease in which the bones lose minerals and strength with aging. This can result in serious bone fractures. Your risk for osteoporosis can be identified using a bone density scan.  If you are 17 years of age or older, or if you are at risk for osteoporosis and fractures, ask your health care provider if you should be screened.  Ask your health care provider whether you should take a calcium or vitamin D supplement to lower your risk for osteoporosis.  Menopause may have certain physical symptoms and risks.  Hormone replacement therapy may reduce some of these symptoms and risks. Talk to your health care provider about whether hormone replacement therapy is right for you. Follow these instructions at home:  Schedule regular health, dental, and eye exams.  Stay current with your  immunizations.  Do not use any tobacco products including cigarettes, chewing tobacco, or electronic cigarettes.  If you are pregnant, do not drink alcohol.  If you are breastfeeding, limit how much and how often you drink alcohol.  Limit alcohol intake to no more than 1 drink per day for nonpregnant women. One drink equals 12 ounces of beer, 5 ounces of wine, or 1 ounces of hard liquor.  Do not use street drugs.  Do not share needles.  Ask your health care provider for help if you need support or information about quitting drugs.  Tell your health care provider if you often feel depressed.  Tell your health care provider if you have ever been abused or do not feel safe at home. This information is not intended to replace advice given to you by your health care provider. Make sure you discuss any questions you have with your health care provider. Document Released: 11/28/2010 Document Revised: 10/21/2015 Document Reviewed: 02/16/2015 Elsevier Interactive Patient Education  [employer].

## 2017-08-07 NOTE — Assessment & Plan Note (Signed)
PH 27 is 10 today. She denies suicidal thoughts today. She is agreeable to meeting with Colen DarlingLisa Flores, our social worker, for cough therapy. She is going to consider restarting Zoloft, however she is reluctant at this time. I want her to follow-up with me in 2 weeks. I discussed with patient that if they develop any SI, to tell someone immediately and seek medical attention.

## 2017-08-08 ENCOUNTER — Telehealth: Payer: Self-pay | Admitting: Physician Assistant

## 2017-08-08 ENCOUNTER — Other Ambulatory Visit: Payer: Self-pay | Admitting: Physician Assistant

## 2017-08-08 ENCOUNTER — Other Ambulatory Visit (INDEPENDENT_AMBULATORY_CARE_PROVIDER_SITE_OTHER): Payer: BLUE CROSS/BLUE SHIELD

## 2017-08-08 DIAGNOSIS — N926 Irregular menstruation, unspecified: Secondary | ICD-10-CM | POA: Diagnosis not present

## 2017-08-08 LAB — HCG, QUANTITATIVE, PREGNANCY: Quantitative HCG: 2.04 m[IU]/mL

## 2017-08-08 NOTE — Telephone Encounter (Signed)
Copied from CRM [phone]. Topic: Quick Communication - See Telephone Encounter >> Aug 08, 2017  2:29 PM Terisa Starraylor, Brittany L wrote: CRM for notification. See Telephone encounter for:   08/08/17.  Pt said a nurse called her today & went over her labs. She said she is confused now more than before & wants the nurse to call her back [phone]

## 2017-08-08 NOTE — Telephone Encounter (Signed)
See note

## 2017-08-09 NOTE — Telephone Encounter (Signed)
Pt calling to check status to see if she can get clarification on her lab results. She is very confused after speaking with the nurse yesterday while getting her results. Pt would like to speak with Jessica Juarez if possible.

## 2017-08-09 NOTE — Telephone Encounter (Signed)
I personally spoke with patient and discussed results over the phone with her.   I discussed with her that one of her hormones may indicate that she is nearing menopause. I would like for her to see ob-gyn for further evaluation and treatment. I did check a blood pregnancy test as this can influence these hormone numbers. Her blood pregnancy test was negative, as the number was under 5, but I would like to recheck this lab in our office 48 hours to make sure that the number does not go up because that could indicate that there is something else going on. Patient reports that her tubes were tied and she doesn't want a repeat beta hcg test, so I will not order that at this time. Defer to ob-gyn.  I discussed that her cholesterol looks fine and thyroid looks good - I recommend staying on the dosage that she is currently on.  F/u in 2 weeks.  Jarold MottoSamantha Tal Neer PA-C 08/09/17

## 2017-08-09 NOTE — Telephone Encounter (Signed)
See note

## 2017-08-14 ENCOUNTER — Encounter: Payer: Self-pay | Admitting: Physician Assistant

## 2017-08-14 ENCOUNTER — Telehealth: Payer: Self-pay | Admitting: Obstetrics and Gynecology

## 2017-08-14 LAB — T3, FREE: T3, Free: 96

## 2017-08-14 LAB — LUTEINIZING HORMONE: LH: 15.3

## 2017-08-14 LAB — T4, FREE: T4,Free (Direct): 1.7

## 2017-08-14 NOTE — Telephone Encounter (Signed)
Called and left a message for patient to call back to schedule a new patient doctor referral appointment with our office to see Dr. Jertson for irregular cycles °

## 2017-08-15 NOTE — Telephone Encounter (Signed)
Called and left a message for patient to call back to schedule a new patient doctor referral appointment with our office to see Dr. Oscar LaJertson for irregular cycles

## 2017-08-16 NOTE — Telephone Encounter (Signed)
Called and left a message for patient to call back to schedule a new patient doctor referral appointment with our office to see Dr. Jertson for irregular cycles °

## 2017-08-17 NOTE — Telephone Encounter (Signed)
Routing referral back to referring office. Patient has not returned multiple calls to schedule an appointment with our office. °

## 2017-08-28 ENCOUNTER — Ambulatory Visit: Payer: BLUE CROSS/BLUE SHIELD | Admitting: Physician Assistant

## 2017-09-12 ENCOUNTER — Other Ambulatory Visit: Payer: Self-pay | Admitting: Physician Assistant

## 2017-09-13 NOTE — Telephone Encounter (Signed)
Sam, please advise on refill.

## 2017-09-18 NOTE — Progress Notes (Signed)
Jessica Juarez is a 47 y.o. female is here to discuss: Anxiety and Depression  I acted as a Neurosurgeon for [employer], PA-C Corky Mull, LPN  History of Present Illness:   Chief Complaint  Patient presents with  . Anxiety  . Depression    Anxiety  Presents for follow-up visit. Symptoms include insomnia (only if she does not take Lorazepam). Patient reports no chest pain, confusion, decreased concentration, depressed mood, dizziness, dry mouth, excessive worry, feeling of choking, irritability, muscle tension, nausea, nervous/anxious behavior, palpitations, panic, restlessness, shortness of breath or suicidal ideas. Symptoms occur rarely. The severity of symptoms is mild. The quality of sleep is good. Nighttime awakenings: occasional.   Compliance with medications is 76-100%. Treatment side effects: None pt tolerating medication.  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  Associated symptoms include insomnia (only if she does not take Lorazepam).  Associated symptoms include no decreased concentration, no fatigue, no helplessness, no hopelessness, not irritable, no restlessness, no decreased interest, no body aches, no headaches, not sad and no suicidal ideas.  Previous treatment provided significant relief.  Past medical history includes anxiety.    She started a job one week after seeing me last. Doing MUCH better.  Wt Readings from Last 5 Encounters:  09/19/17 102 lb 4 oz (46.4 kg)  08/07/17 105 lb 6.1 oz (47.8 kg)  06/22/17 107 lb 9.6 oz (48.8 kg)  03/16/17 106 lb 4 oz (48.2 kg)   GAD 7 : Generalized Anxiety Score 09/19/2017 08/07/2017  Nervous, Anxious, on Edge 0 3  Control/stop worrying 0 2  Worry too much - different things 0 2  Trouble relaxing 0 2  Restless 0 1  Easily annoyed or irritable 0 1  Afraid - awful might happen 0 1  Total GAD 7 Score 0 12    Anxiety Difficulty Not difficult at all -    Depression screen [facility] 2/9 09/19/2017 08/07/2017 08/07/2017  Decreased Interest 0 1 0  Down, Depressed, Hopeless 0 1 0  PHQ - 2 Score 0 2 0  Altered sleeping 0 1 -  Tired, decreased energy 0 2 -  Change in appetite 0 2 -  Feeling bad or failure about yourself  0 1 -  Trouble concentrating 0 1 -  Moving slowly or fidgety/restless 0 1 -  Suicidal thoughts 0 0 -  PHQ-9 Score 0 10 -  Difficult doing work/chores Not difficult at all Somewhat difficult -      Health Maintenance Due  Topic Date Due  . MAMMOGRAM  09/09/1986  . PAP SMEAR  09/08/1989    Past Medical History:  Diagnosis Date  . Anxiety   . Heart murmur      Social History   Socioeconomic History  . Marital status: Married    Spouse name: Not on file  . Number of children: Not on file  . Years of education: Not on file  . Highest education level: Not on file  Occupational History  . Not on file  Social Needs  . Financial resource strain: Not on file  . Food insecurity:    Worry: Not on file    Inability: Not on file  . Transportation needs:    Medical: Not on file    Non-medical: Not on file  Tobacco Use  . Smoking status: Never Smoker  .  Smokeless tobacco: Never Used  Substance and Sexual Activity  . Alcohol use: Yes  . Drug use: No  . Sexual activity: Yes  Lifestyle  . Physical activity:    Days per week: Not on file    Minutes per session: Not on file  . Stress: Not on file  Relationships  . Social connections:    Talks on phone: Not on file    Gets together: Not on file    Attends religious service: Not on file    Active member of club or organization: Not on file    Attends meetings of clubs or organizations: Not on file    Relationship status: Not on file  . Intimate partner violence:    Fear of current or ex partner: Not on file    Emotionally abused: Not on file    Physically abused: Not on file    Forced sexual activity: Not on file   Other Topics Concern  . 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 CESAREAN SECTION    . EYE SURGERY    . TUBAL LIGATION    . WISDOM TOOTH EXTRACTION      Family History  Problem Relation Age of Onset  . Diabetes Mother   . Depression Mother   . Colon cancer Father        [id number]  . Liver cancer Father   . Thyroid disease Brother   . Migraines Son   . Diabetes Maternal Grandmother   . Colon cancer Paternal Grandfather     PMHx, SurgHx, SocialHx, FamHx, Medications, and Allergies were reviewed in the Visit Navigator and updated as appropriate.   Patient Active Problem List   Diagnosis Date Noted  . Vitamin D deficiency 08/07/2017  . Constipation 08/07/2017  . Anxiety 08/07/2017  . Situational depression 08/07/2017  . Herpes simplex labialis 08/07/2017  . Hypothyroidism 08/07/2017    Social History   Tobacco Use  . Smoking status: Never Smoker  . Smokeless tobacco: Never Used  Substance Use Topics  . Alcohol use: Yes  . Drug use: No    Current Medications and Allergies:    Current Outpatient Medications:  .  cyclobenzaprine (FLEXERIL) 5 MG tablet, TAKE 0.5 TABLETS (2.5 MG TOTAL) BY MOUTH AT BEDTIME. (Patient taking differently: Take 2.5 mg by mouth as needed. ), Disp: 20 tablet, Rfl: 0 .  levothyroxine (SYNTHROID, LEVOTHROID) 50 MCG tablet, Take 1 tablet (50 mcg total) by mouth daily., Disp: 90 tablet, Rfl: 3 .  LORazepam (ATIVAN) 1 MG tablet, Take 1 tablet (1 mg total) by mouth at bedtime., Disp: 90 tablet, Rfl: 0 .  valACYclovir (VALTREX) 1000 MG tablet, Take two tablets ( total 2000 mg) by mouth q12h x 1 day; Start: ASAP after symptom onset, Disp: 6 tablet, Rfl: 2 .  Vitamin D, Ergocalciferol, 2000 units CAPS, Take 3 capsules by mouth daily. , Disp: , Rfl:    Allergies  Allergen Reactions  . Keflex [Cephalexin] Hives  . Azithromycin Palpitations    Review of  Systems   Review of Systems  Constitutional: Negative for fatigue and irritability.  Respiratory: Negative for shortness of breath.   Cardiovascular: Negative for chest pain and palpitations.  Gastrointestinal: Negative for nausea.  Neurological: Negative for dizziness and headaches.  Psychiatric/Behavioral: Positive for depression. Negative for confusion, decreased concentration and suicidal ideas. The patient has insomnia (only if  she does not take Lorazepam). The patient is not nervous/anxious.     Vitals:   Vitals:   09/19/17 0914  BP: 104/70  Pulse: 81  Temp: 98.8 F (37.1 C)  TempSrc: Oral  SpO2: 97%  Weight: 102 lb 4 oz (46.4 kg)  Height: 4\' 10"  (1.473 m)     Body mass index is 21.37 kg/m.   Physical Exam:    Physical Exam  Constitutional: She appears well-developed. She is not irritable and cooperative.  Non-toxic appearance. She does not have a sickly appearance. She does not appear ill. No distress.  Cardiovascular: Normal rate, regular rhythm, S1 normal, S2 normal, normal heart sounds and normal pulses.  No LE edema  Pulmonary/Chest: Effort normal and breath sounds normal.  Neurological: She is alert. GCS eye subscore is 4. GCS verbal subscore is 5. GCS motor subscore is 6.  Skin: Skin is warm, dry and intact.  Psychiatric: She has a normal mood and affect. Her speech is normal and behavior is normal.  Nursing note and vitals reviewed.    Assessment and Plan:    Verena was seen today for anxiety and depression.  Diagnoses and all orders for this visit:  Situational depression Resolved. She is doing much better today, and has been doing well since she started her new job. Provided emotional support. No need for intervention at this time.  Adjustment insomnia Ongoing. She is trying to wean of her ativan. She is going to try to take only 1/2 tablet at night to see if she can sleep with that decreased dose. She is also considering melatonin. [place] Controlled  Substance Database reviewed today regarding patient. Patient is compliant with [facility] regarding pharmacy use and one-prescribing provider. F/U in 3 months. -     LORazepam (ATIVAN) 1 MG tablet; Take 1 tablet (1 mg total) by mouth at bedtime.  . Reviewed expectations re: course of current medical issues. . Discussed self-management of symptoms. . Outlined signs and symptoms indicating need for more acute intervention. . Patient verbalized understanding and all questions were answered. . See orders for this visit as documented in the electronic medical record. . Patient received an After Visit Summary.  CMA or LPN served as scribe during this visit. History, Physical, and Plan performed by medical provider. Documentation and orders reviewed and attested to.  Jarold Motto, PA-C [place] 09/19/2017  Follow-up: Return in about 3 months (around 12/19/2017) for Medication F/U.

## 2017-09-19 ENCOUNTER — Ambulatory Visit: Payer: BLUE CROSS/BLUE SHIELD | Admitting: Physician Assistant

## 2017-09-19 ENCOUNTER — Encounter: Payer: Self-pay | Admitting: Physician Assistant

## 2017-09-19 VITALS — BP 104/70 | HR 81 | Temp 98.8°F | Ht <= 58 in | Wt 102.2 lb

## 2017-09-19 DIAGNOSIS — F5102 Adjustment insomnia: Secondary | ICD-10-CM

## 2017-09-19 DIAGNOSIS — F4321 Adjustment disorder with depressed mood: Secondary | ICD-10-CM

## 2017-09-19 MED ORDER — LORAZEPAM 1 MG PO TABS: 1.0000 mg | ORAL_TABLET | Freq: Every day | ORAL | 0 refills | Status: AC

## 2017-09-19 NOTE — Patient Instructions (Signed)
It was great to see you, so glad you are doing well :)  Follow-up in 3 months, sooner if needed.  Try to only take 1/2 tablet of Ativan at night if possible.

## 2017-10-09 ENCOUNTER — Encounter: Payer: Self-pay | Admitting: Physician Assistant

## 2017-10-20 ENCOUNTER — Other Ambulatory Visit: Payer: Self-pay | Admitting: Physician Assistant

## 2017-12-12 ENCOUNTER — Ambulatory Visit: Payer: BLUE CROSS/BLUE SHIELD | Admitting: Physician Assistant

## 2017-12-19 ENCOUNTER — Ambulatory Visit: Payer: BLUE CROSS/BLUE SHIELD | Admitting: Physician Assistant

## 2017-12-24 ENCOUNTER — Encounter: Payer: Self-pay | Admitting: Physician Assistant

## 2017-12-24 ENCOUNTER — Ambulatory Visit: Payer: BLUE CROSS/BLUE SHIELD | Admitting: Physician Assistant

## 2017-12-24 VITALS — BP 100/70 | HR 69 | Temp 98.8°F | Ht <= 58 in | Wt 97.2 lb

## 2017-12-24 DIAGNOSIS — F419 Anxiety disorder, unspecified: Secondary | ICD-10-CM

## 2017-12-24 MED ORDER — LORAZEPAM 1 MG PO TABS: 1.0000 mg | ORAL_TABLET | Freq: Every day | ORAL | 2 refills | Status: DC

## 2017-12-24 NOTE — Patient Instructions (Addendum)
It was great to see you!  Resume Ativan 1 mg daily.  Re-start the sertraline 25 mg daily. Give this 4-6 weeks to work. You may increase to 50 mg if needed. I would like for you to follow-up in 1-3 months to determine if this is effective for you.

## 2017-12-24 NOTE — Progress Notes (Signed)
Jessica Juarez is a 47 y.o. female is here to discuss: Anxiety  I acted as a Neurosurgeonscribe for [employer] CorporationSamantha Day Greb, PA-C [name], LPN  History of Present Illness:   Chief Complaint  Patient presents with  . Anxiety    Anxiety  Presents for follow-up (Pt is here for follow up, has been out of medicaiton x 6 days and symptoms have gotten worse since off medication.) visit. Symptoms include decreased concentration, depressed mood, excessive worry, insomnia, irritability, malaise, nausea, nervous/anxious behavior, panic and restlessness. Patient reports no shortness of breath or suicidal ideas. Symptoms occur constantly. The severity of symptoms is moderate, causing significant distress and interfering with daily activities. Hours of sleep per night: Pt sleeps straight for about 6 hours deep sleep. Since medication pt is not able to sleep only sleeps an hour at a time and then up for hours. Sleep quality: Good when on Ativan, poor when off.   Compliance with medications is 76-100%. Treatment side effects: None, pt is tolerating medication well taking Ativan 1 mg at bedtime. Pt tried to cut in half for about [redacted] weeks along with taking Melatonin 10 mg but unable to sleep and feeling on edge so went back to 1 mg.   Jessica Juarez has been without her Ativan for 5 to 6 days.  Jessica Juarez has been on this medication for over 10 years per her report.  Jessica Juarez was briefly on sertraline last year, but stopped taking it because Jessica Juarez did not want to be on it anymore.    Wt Readings from Last 5 Encounters:  12/24/17 97 lb 4 oz (44.1 kg)  09/19/17 102 lb 4 oz (46.4 kg)  08/07/17 105 lb 6.1 oz (47.8 kg)  06/22/17 107 lb 9.6 oz (48.8 kg)  03/16/17 106 lb 4 oz (48.2 kg)   GAD 7 : Generalized Anxiety Score 12/24/2017 09/19/2017 08/07/2017  Nervous, Anxious, on Edge 1 0 3  Control/stop worrying 1 0 2  Worry too much - different things 1 0 2  Trouble relaxing 1 0 2  Restless 1 0 1  Easily annoyed or irritable 1 0 1  Afraid - awful  might happen 1 0 1  Total GAD 7 Score 7 0 12  Anxiety Difficulty Somewhat difficult Not difficult at all -      There are no preventive care reminders to display for this patient.  Past Medical History:  Diagnosis Date  . Anxiety   . Heart murmur      Social History   Socioeconomic History  . Marital status: Married    Spouse name: Not on file  . Number of children: Not on file  . Years of education: Not on file  . Highest education level: Not on file  Occupational History  . Not on file  Social Needs  . Financial resource strain: Not on file  . Food insecurity:    Worry: Not on file    Inability: Not on file  . Transportation needs:    Medical: Not on file    Non-medical: Not on file  Tobacco Use  . Smoking status: Never Smoker  . Smokeless tobacco: Never Used  Substance and Sexual Activity  . Alcohol use: Yes  . Drug use: No  . Sexual activity: Yes  Lifestyle  . Physical activity:    Days per week: Not on file    Minutes per session: Not on file  . Stress: Not on file  Relationships  . Social connections:    Talks  on phone: Not on file    Gets together: Not on file    Attends religious service: Not on file    Active member of club or organization: Not on file    Attends meetings of clubs or organizations: Not on file    Relationship status: Not on file  . Intimate partner violence:    Fear of current or ex partner: Not on file    Emotionally abused: Not on file    Physically abused: Not on file    Forced sexual activity: Not on file  Other Topics Concern  . 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 CESAREAN SECTION    . EYE SURGERY    . TUBAL LIGATION    . WISDOM TOOTH EXTRACTION      Family History  Problem Relation Age of Onset  . Diabetes Mother   . Depression Mother   . Colon cancer Father        20  . Liver cancer Father   . Thyroid disease  Brother   . Migraines Son   . Diabetes Maternal Grandmother   . Colon cancer Paternal Grandfather     PMHx, SurgHx, SocialHx, FamHx, Medications, and Allergies were reviewed in the Visit Navigator and updated as appropriate.   Patient Active Problem List   Diagnosis Date Noted  . Vitamin D deficiency 08/07/2017  . Constipation 08/07/2017  . Anxiety 08/07/2017  . Situational depression 08/07/2017  . Herpes simplex labialis 08/07/2017  . Hypothyroidism 08/07/2017    Social History   Tobacco Use  . Smoking status: Never Smoker  . Smokeless tobacco: Never Used  Substance Use Topics  . Alcohol use: Yes  . Drug use: No    Current Medications and Allergies:    Current Outpatient Medications:  .  cyclobenzaprine (FLEXERIL) 5 MG tablet, TAKE 0.5 TABLETS (2.5 MG TOTAL) BY MOUTH AT BEDTIME. (Patient taking differently: Take 2.5 mg by mouth at bedtime as needed. ), Disp: 20 tablet, Rfl: 0 .  levothyroxine (SYNTHROID, LEVOTHROID) 50 MCG tablet, Take 1 tablet (50 mcg total) by mouth daily., Disp: 90 tablet, Rfl: 3 .  LORazepam (ATIVAN) 1 MG tablet, Take 1 tablet (1 mg total) by mouth at bedtime., Disp: 30 tablet, Rfl: 2 .  valACYclovir (VALTREX) 1000 MG tablet, Take two tablets ( total 2000 mg) by mouth q12h x 1 day; Start: ASAP after symptom onset, Disp: 6 tablet, Rfl: 2 .  Vitamin D, Ergocalciferol, 2000 units CAPS, Take 3 capsules by mouth daily. , Disp: , Rfl:    Allergies  Allergen Reactions  . Keflex [Cephalexin] Hives  . Azithromycin Palpitations    Review of Systems   Review of Systems  Constitutional: Positive for irritability.  Respiratory: Negative for shortness of breath.   Gastrointestinal: Positive for nausea.  Psychiatric/Behavioral: Positive for decreased concentration. Negative for suicidal ideas. The patient is nervous/anxious and has insomnia.     Vitals:   Vitals:   12/24/17 1115  BP: 100/70  Pulse: 69  Temp: 98.8 F (37.1 C)  TempSrc: Oral  SpO2:  99%  Weight: 97 lb 4 oz (44.1 kg)  Height: 4\' 10"  (1.473 m)     Body mass index is 20.33 kg/m.   Physical Exam:    Physical Exam  Constitutional: Jessica Juarez appears well-developed. Jessica Juarez is cooperative.  Non-toxic appearance. Jessica Juarez does not have a sickly appearance.  Jessica Juarez does not appear ill. No distress.  Cardiovascular: Normal rate, regular rhythm, S1 normal, S2 normal, normal heart sounds and normal pulses.  No LE edema  Pulmonary/Chest: Effort normal and breath sounds normal.  Neurological: Jessica Juarez is alert. GCS eye subscore is 4. GCS verbal subscore is 5. GCS motor subscore is 6.  Skin: Skin is warm, dry and intact.  Psychiatric: Her speech is normal and behavior is normal. Her mood appears anxious.  Nursing note and vitals reviewed.    Assessment and Plan:    Jessica Juarez was seen today for anxiety.  Diagnoses and all orders for this visit:  Anxiety  Other orders -     LORazepam (ATIVAN) 1 MG tablet; Take 1 tablet (1 mg total) by mouth at bedtime.   Uncontrolled. We had a long discussion about her anxiety control.  Jessica Juarez is using the Ativan for insomnia, however Jessica Juarez feels that Jessica Juarez is dealing with worsening anxiety.  I discussed that long-term use of Ativan is not ideal for control of anxiety.  Jessica Juarez is in agreement to eventually wean off of this.  For now we are going to resume her Ativan 1 mg daily at bedtime.  Jessica Juarez is also agreeable to restart Zoloft.  We are going to restart at 25 mg and I asked that Jessica Juarez stay on this dose consistently for 4 weeks.  After about 4 weeks if Jessica Juarez needs to, Jessica Juarez can increase to 50 mg.  I would like to follow-up with her within 1 to 3 months, I will leave it up to her.  Sooner if needed.  Our goal is to continue the Zoloft, as we wean the Ativan.  Patient is agreeable to plan.  . Reviewed expectations re: course of current medical issues. . Discussed self-management of symptoms. . Outlined signs and symptoms indicating need for more acute intervention. . Patient  verbalized understanding and all questions were answered. . See orders for this visit as documented in the electronic medical record. . Patient received an After Visit Summary.  CMA or LPN served as scribe during this visit. History, Physical, and Plan performed by medical provider. Documentation and orders reviewed and attested to.   Jarold Motto, PA-C [employer], [place] 12/24/2017  Follow-up: No follow-ups on file.

## 2017-12-25 ENCOUNTER — Encounter: Payer: Self-pay | Admitting: *Deleted

## 2017-12-25 ENCOUNTER — Telehealth: Payer: Self-pay | Admitting: *Deleted

## 2017-12-25 NOTE — Telephone Encounter (Signed)
Spoke to pt, told her work excuse is ready for pickup. Pt verbalized understanding and said she will pick it up tomorrow morning. Told her fine it will be at the front desk for her. Pt verbalized understanding.

## 2017-12-25 NOTE — Telephone Encounter (Signed)
Copied from CRM [id number]. Topic: General - Other >> Dec 25, 2017  1:05 PM Leafy Roobinson, Norma J wrote: Reason for CRM: pt saw worley yesterday and was given work note to be excuse from work today 12-25-17. Pt needs another work note to be excuse from work tomorrow 12-26-17 and return to work on 12-27-17

## 2017-12-28 ENCOUNTER — Encounter: Payer: Self-pay | Admitting: *Deleted

## 2017-12-28 NOTE — Telephone Encounter (Signed)
Spoke to pt told her I have her letter ready to return to work on Monday. Told pt if she needs anymore time off will need to be seen. Told pt I will put letter at front desk for pickup. Pt verbalized understanding and will be right over to pick up.

## 2017-12-28 NOTE — Telephone Encounter (Addendum)
Pt is calling and need another work note to return back to work on 12-31-17. Pt needed more time off. Pt would like to pick up work note today

## 2018-03-21 ENCOUNTER — Ambulatory Visit: Payer: BLUE CROSS/BLUE SHIELD | Admitting: Physician Assistant

## 2018-03-25 ENCOUNTER — Encounter: Payer: Self-pay | Admitting: Physician Assistant

## 2018-03-25 ENCOUNTER — Ambulatory Visit: Payer: BLUE CROSS/BLUE SHIELD | Admitting: Physician Assistant

## 2018-03-25 ENCOUNTER — Ambulatory Visit: Payer: Self-pay | Admitting: Physician Assistant

## 2018-03-25 VITALS — BP 118/76 | HR 68 | Temp 98.5°F | Ht <= 58 in | Wt 102.0 lb

## 2018-03-25 DIAGNOSIS — F5102 Adjustment insomnia: Secondary | ICD-10-CM

## 2018-03-25 DIAGNOSIS — F419 Anxiety disorder, unspecified: Secondary | ICD-10-CM

## 2018-03-25 DIAGNOSIS — F4321 Adjustment disorder with depressed mood: Secondary | ICD-10-CM

## 2018-03-25 MED ORDER — SERTRALINE HCL 50 MG PO TABS: 50.0000 mg | ORAL_TABLET | Freq: Every day | ORAL | 1 refills | Status: DC

## 2018-03-25 MED ORDER — LORAZEPAM 1 MG PO TABS: 1.0000 mg | ORAL_TABLET | Freq: Every day | ORAL | 2 refills | Status: DC

## 2018-03-25 NOTE — Patient Instructions (Signed)
It was great to see you!  Continue medications as prescribed.  Let's follow-up in 3 months, sooner if you have concerns.  Take care,  Jarold Motto PA-C

## 2018-03-25 NOTE — Progress Notes (Signed)
Jessica Juarez is a 48 y.o. female here for a follow up of a pre-existing problem.  History of Present Illness:   Chief Complaint  Patient presents with  . Medication Management    HPI   Anxiety, Situational Depression, Adjustment Insomnia -- she is doing much better on Zoloft 50 mg and Ativan 1 mg daily at night. She is tolerating medications very well. She started a new job about a month ago and she is much happier. She is eating and drinking well. Her weight has slightly increased as well. She denies any need for changed dosages. Denies panic attacks and SI/HI.  Wt Readings from Last 4 Encounters:  03/25/18 102 lb (46.3 kg)  12/24/17 97 lb 4 oz (44.1 kg)  09/19/17 102 lb 4 oz (46.4 kg)  08/07/17 105 lb 6.1 oz (47.8 kg)     Past Medical History:  Diagnosis Date  . Anxiety   . Heart murmur      Social History   Socioeconomic History  . Marital status: Married    Spouse name: Not on file  . Number of children: Not on file  . Years of education: Not on file  . Highest education level: Not on file  Occupational History  . Not on file  Social Needs  . Financial resource strain: Not on file  . Food insecurity:    Worry: Not on file    Inability: Not on file  . Transportation needs:    Medical: Not on file    Non-medical: Not on file  Tobacco Use  . Smoking status: Never Smoker  . Smokeless tobacco: Never Used  Substance and Sexual Activity  . Alcohol use: Yes  . Drug use: No  . Sexual activity: Yes  Lifestyle  . Physical activity:    Days per week: Not on file    Minutes per session: Not on file  . Stress: Not on file  Relationships  . Social connections:    Talks on phone: Not on file    Gets together: Not on file    Attends religious service: Not on file    Active member of club or organization: Not on file    Attends meetings of clubs or organizations: Not on file    Relationship status: Not on file  . Intimate partner violence:    Fear of current or ex  partner: Not on file    Emotionally abused: Not on file    Physically abused: Not on file    Forced sexual activity: Not on file  Other Topics Concern  . 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 CESAREAN SECTION    . EYE SURGERY    . TUBAL LIGATION    . WISDOM TOOTH EXTRACTION      Family History  Problem Relation Age of Onset  . Diabetes Mother   . Depression Mother   . Colon cancer Father        29  . Liver cancer Father   . Thyroid disease Brother   . Migraines Son   . Diabetes Maternal Grandmother   . Colon cancer Paternal Grandfather     Allergies  Allergen Reactions  . Keflex [Cephalexin] Hives  . Azithromycin Palpitations    Current Medications:   Current Outpatient Medications:  .  cyclobenzaprine (FLEXERIL) 5 MG tablet, TAKE 0.5 TABLETS (2.5 MG TOTAL) BY  MOUTH AT BEDTIME. (Patient taking differently: Take 2.5 mg by mouth at bedtime as needed. ), Disp: 20 tablet, Rfl: 0 .  levothyroxine (SYNTHROID, LEVOTHROID) 50 MCG tablet, Take 1 tablet (50 mcg total) by mouth daily., Disp: 90 tablet, Rfl: 3 .  LORazepam (ATIVAN) 1 MG tablet, Take 1 tablet (1 mg total) by mouth at bedtime., Disp: 30 tablet, Rfl: 2 .  sertraline (ZOLOFT) 50 MG tablet, Take 1 tablet (50 mg total) by mouth daily., Disp: 90 tablet, Rfl: 1 .  valACYclovir (VALTREX) 1000 MG tablet, Take two tablets ( total 2000 mg) by mouth q12h x 1 day; Start: ASAP after symptom onset, Disp: 6 tablet, Rfl: 2 .  Vitamin D, Ergocalciferol, 2000 units CAPS, Take 3 capsules by mouth daily. , Disp: , Rfl:    Review of Systems:   ROS Negative unless otherwise specified per HPI.  Vitals:   Vitals:   03/25/18 0835  BP: 118/76  Pulse: 68  Temp: 98.5 F (36.9 C)  TempSrc: Oral  SpO2: 99%  Weight: 102 lb (46.3 kg)  Height: 4\' 10"  (1.473 m)     Body mass index is 21.32 kg/m.  Physical Exam:   Physical Exam   Constitutional: She appears well-developed. She is cooperative.  Non-toxic appearance. She does not have a sickly appearance. She does not appear ill. No distress.  Cardiovascular: Normal rate, regular rhythm, S1 normal, S2 normal, normal heart sounds and normal pulses.  No LE edema  Pulmonary/Chest: Effort normal and breath sounds normal.  Neurological: She is alert. GCS eye subscore is 4. GCS verbal subscore is 5. GCS motor subscore is 6.  Skin: Skin is warm, dry and intact.  Psychiatric: She has a normal mood and affect. Her speech is normal and behavior is normal.  Nursing note and vitals reviewed.   Assessment and Plan:    Sheneka was seen today for medication management.  Diagnoses and all orders for this visit:  Anxiety  Adjustment insomnia  Situational depression  Other orders -     sertraline (ZOLOFT) 50 MG tablet; Take 1 tablet (50 mg total) by mouth daily. -     LORazepam (ATIVAN) 1 MG tablet; Take 1 tablet (1 mg total) by mouth at bedtime.   She is doing much better on her current regimen. Continue current plan. Denies need for increase at this time. She will follow-up in 3 months and should have health insurance with her new job at that time. We will plan to re-check her TSH at follow.  . Reviewed expectations re: course of current medical issues. . Discussed self-management of symptoms. . Outlined signs and symptoms indicating need for more acute intervention. . Patient verbalized understanding and all questions were answered. . See orders for this visit as documented in the electronic medical record. . Patient received an After-Visit Summary.  Jarold Motto, PA-C

## 2018-06-24 ENCOUNTER — Ambulatory Visit: Payer: Self-pay | Admitting: Physician Assistant

## 2018-06-24 ENCOUNTER — Encounter: Payer: Self-pay | Admitting: Physician Assistant

## 2018-06-24 VITALS — BP 102/76 | HR 64 | Temp 98.8°F | Ht <= 58 in | Wt 100.5 lb

## 2018-06-24 DIAGNOSIS — F419 Anxiety disorder, unspecified: Secondary | ICD-10-CM

## 2018-06-24 DIAGNOSIS — F4321 Adjustment disorder with depressed mood: Secondary | ICD-10-CM

## 2018-06-24 DIAGNOSIS — R110 Nausea: Secondary | ICD-10-CM

## 2018-06-24 MED ORDER — ONDANSETRON 4 MG PO TBDP
4.0000 mg | ORAL_TABLET | Freq: Once | ORAL | Status: AC
  Administered 2018-06-24: 4 mg via ORAL

## 2018-06-24 MED ORDER — OSELTAMIVIR PHOSPHATE 75 MG PO CAPS: 75.0000 mg | ORAL_CAPSULE | Freq: Two times a day (BID) | ORAL | 0 refills | Status: DC

## 2018-06-24 MED ORDER — ONDANSETRON HCL 4 MG PO TABS: 4.0000 mg | ORAL_TABLET | Freq: Three times a day (TID) | ORAL | 0 refills | Status: DC | PRN

## 2018-06-24 MED ORDER — LORAZEPAM 1 MG PO TABS: 1.0000 mg | ORAL_TABLET | Freq: Every day | ORAL | 2 refills | Status: DC

## 2018-06-24 NOTE — Progress Notes (Signed)
Jessica Juarez is a 48 y.o. female is here to follow up on Anxiety & Depression.  I acted as a Neurosurgeonscribe for [employer]antha Donaven Criswell, PA-C Jessica Mullonna Orphanos, LPN   History of Present Illness:   Chief Complaint  Patient presents with  . Anxiety  . Depression  . Flu like symptoms    HPI  Pt here today to follow up on Anxiety and Depression. Pt states Anxiety is better and depression is under control. Pt does not have insurance at present. She is currently on Zoloft 50 mg and Ativan 1 mg daily. Denies SI/HI.  Flu like symptoms Pt c/o nausea, dizziness since Saturday also having headache and diarrhea. No fever.  Eating and drinking is decreased. Several people at work with similar symptoms. Denies: chest pain, SOB, blurred vision   There are no preventive care reminders to display for this patient.  Past Medical History:  Diagnosis Date  . Anxiety   . Heart murmur      Social History   Socioeconomic History  . Marital status: Married    Spouse name: Not on file  . Number of children: Not on file  . Years of education: Not on file  . Highest education level: Not on file  Occupational History  . Not on file  Social Needs  . Financial resource strain: Not on file  . Food insecurity:    Worry: Not on file    Inability: Not on file  . Transportation needs:    Medical: Not on file    Non-medical: Not on file  Tobacco Use  . Smoking status: Never Smoker  . Smokeless tobacco: Never Used  Substance and Sexual Activity  . Alcohol use: Yes  . Drug use: No  . Sexual activity: Yes  Lifestyle  . Physical activity:    Days per week: Not on file    Minutes per session: Not on file  . Stress: Not on file  Relationships  . Social connections:    Talks on phone: Not on file    Gets together: Not on file    Attends religious service: Not on file    Active member of club or organization: Not on file    Attends meetings of clubs or organizations: Not on file    Relationship status: Not on  file  . Intimate partner violence:    Fear of current or ex partner: Not on file    Emotionally abused: Not on file    Physically abused: Not on file    Forced sexual activity: Not on file  Other Topics Concern  . 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 CESAREAN SECTION    . EYE SURGERY    . TUBAL LIGATION    . WISDOM TOOTH EXTRACTION      Family History  Problem Relation Age of Onset  . Diabetes Mother   . Depression Mother   . Colon cancer Father        764  . Liver cancer Father   . Thyroid disease Brother   . Migraines Son   . Diabetes Maternal Grandmother   . Colon cancer Paternal Grandfather     PMHx, SurgHx, SocialHx, FamHx, Medications, and Allergies were reviewed in the Visit Navigator and updated as appropriate.   Patient Active Problem List   Diagnosis Date Noted  . Vitamin D deficiency 08/07/2017  .  Constipation 08/07/2017  . Anxiety 08/07/2017  . Situational depression 08/07/2017  . Herpes simplex labialis 08/07/2017  . Hypothyroidism 08/07/2017    Social History   Tobacco Use  . Smoking status: Never Smoker  . Smokeless tobacco: Never Used  Substance Use Topics  . Alcohol use: Yes  . Drug use: No    Current Medications and Allergies:    Current Outpatient Medications:  .  cyclobenzaprine (FLEXERIL) 5 MG tablet, TAKE 0.5 TABLETS (2.5 MG TOTAL) BY MOUTH AT BEDTIME. (Patient taking differently: Take 2.5 mg by mouth at bedtime as needed. ), Disp: 20 tablet, Rfl: 0 .  levothyroxine (SYNTHROID, LEVOTHROID) 50 MCG tablet, Take 1 tablet (50 mcg total) by mouth daily., Disp: 90 tablet, Rfl: 3 .  LORazepam (ATIVAN) 1 MG tablet, Take 1 tablet (1 mg total) by mouth at bedtime., Disp: 30 tablet, Rfl: 2 .  sertraline (ZOLOFT) 50 MG tablet, Take 1 tablet (50 mg total) by mouth daily., Disp: 90 tablet, Rfl: 1 .  valACYclovir (VALTREX) 1000 MG tablet, Take two  tablets ( total 2000 mg) by mouth q12h x 1 day; Start: ASAP after symptom onset, Disp: 6 tablet, Rfl: 2 .  Vitamin D, Ergocalciferol, 2000 units CAPS, Take 3 capsules by mouth daily. , Disp: , Rfl:  .  ondansetron (ZOFRAN) 4 MG tablet, Take 1 tablet (4 mg total) by mouth every 8 (eight) hours as needed for nausea or vomiting., Disp: 20 tablet, Rfl: 0 .  oseltamivir (TAMIFLU) 75 MG capsule, Take 1 capsule (75 mg total) by mouth 2 (two) times daily., Disp: 10 capsule, Rfl: 0   Allergies  Allergen Reactions  . Keflex [Cephalexin] Hives  . Azithromycin Palpitations    Review of Systems   ROS Negative unless otherwise specified per HPI.  Vitals:   Vitals:   06/24/18 1518  BP: 102/76  Pulse: 64  Temp: 98.8 F (37.1 C)  TempSrc: Oral  SpO2: 98%  Weight: 100 lb 8 oz (45.6 kg)  Height: 4\' 10"  (1.473 m)     Body mass index is 21 kg/m.   Physical Exam:    Physical Exam Vitals signs and nursing note reviewed.  Constitutional:      General: She is not in acute distress.    Appearance: She is well-developed. She is not ill-appearing or toxic-appearing.  Cardiovascular:     Rate and Rhythm: Normal rate and regular rhythm.     Pulses: Normal pulses.     Heart sounds: Normal heart sounds, S1 normal and S2 normal.     Comments: No LE edema Pulmonary:     Effort: Pulmonary effort is normal.     Breath sounds: Normal breath sounds.  Skin:    General: Skin is warm and dry.  Neurological:     Mental Status: She is alert.     GCS: GCS eye subscore is 4. GCS verbal subscore is 5. GCS motor subscore is 6.     Cranial Nerves: Cranial nerves are intact.     Sensory: Sensation is intact.     Motor: Motor function is intact.  Psychiatric:        Speech: Speech normal.        Behavior: Behavior normal. Behavior is cooperative.      Assessment and Plan:    Jessica Juarez was seen today for anxiety, depression and flu like symptoms.  Diagnoses and all orders for this visit:  Nausea No  red flags on exam. Flu-like virus. Declined POC flu test due to cost.  Briefly discussed Tamiflu -- she would like to think about taking it -- I advised that if she did decide to take it, she should do so soon, or it will be less effective. -     ondansetron (ZOFRAN-ODT) disintegrating tablet 4 mg  Situational depression and Anxiety [place] Controlled Substance Database reviewed today regarding patient. Patient is compliant with [facility] regarding pharmacy use and one-prescribing provider. It is appropriate to continue current medication regimen. Discussed pros and  Continue Zoloft as well. Follow-up in 3 months. Denies SI/HI.  Other orders -     oseltamivir (TAMIFLU) 75 MG capsule; Take 1 capsule (75 mg total) by mouth 2 (two) times daily. -     ondansetron (ZOFRAN) 4 MG tablet; Take 1 tablet (4 mg total) by mouth every 8 (eight) hours as needed for nausea or vomiting. -     LORazepam (ATIVAN) 1 MG tablet; Take 1 tablet (1 mg total) by mouth at bedtime.    . Reviewed expectations re: course of current medical issues. . Discussed self-management of symptoms. . Outlined signs and symptoms indicating need for more acute intervention. . Patient verbalized understanding and all questions were answered. . See orders for this visit as documented in the electronic medical record. . Patient received an After Visit Summary.  CMA or LPN served as scribe during this visit. History, Physical, and Plan performed by medical provider. The above documentation has been reviewed and is accurate and complete.   Jarold MottoSamantha Marquise Lambson, PA-C , [facility] 06/24/2018  Follow-up: No follow-ups on file.

## 2018-06-24 NOTE — Patient Instructions (Signed)
Push fluids and rest.  Keep [place] posted on your symptoms.   Nausea, Adult Nausea is the feeling that you have an upset stomach or that you are about to vomit. Nausea on its own is not usually a serious concern, but it may be an early sign of a more serious medical problem. As nausea gets worse, it can lead to vomiting. If vomiting develops, or if you are not able to drink enough fluids, you are at risk of becoming dehydrated. Dehydration can make you tired and thirsty, cause you to have a dry mouth, and decrease how often you urinate. Older adults and people with other diseases or a weak disease-fighting system (immune system) are at higher risk for dehydration. The main goals of treating your nausea are:  To relieve your nausea.  To limit repeated nausea episodes.  To prevent vomiting and dehydration. Follow these instructions at home: Watch your symptoms for any changes. Tell your health care provider about them. Follow these instructions as told by your health care provider. Eating and drinking      Take an oral rehydration solution (ORS). This is a drink that is sold at pharmacies and retail stores.  Drink clear fluids slowly and in small amounts as you are able. Clear fluids include water, ice chips, low-calorie sports drinks, and fruit juice that has water added (diluted fruit juice).  Eat bland, easy-to-digest foods in small amounts as you are able. These foods include bananas, applesauce, rice, lean meats, toast, and crackers.  Avoid drinking fluids that contain a lot of sugar or caffeine, such as energy drinks, sports drinks, and soda.  Avoid alcohol.  Avoid spicy or fatty foods. General instructions  Take over-the-counter and prescription medicines only as told by your health care provider.  Rest at home while you recover.  Drink enough fluid to keep your urine pale yellow.  Breathe slowly and deeply when you feel nauseous.  Avoid smelling things that have strong  odors.  Wash your hands often using soap and water. If soap and water are not available, use hand sanitizer.  Make sure that all people in your household wash their hands well and often.  Keep all follow-up visits as told by your health care provider. This is important. Contact a health care provider if:  Your nausea gets worse.  Your nausea does not go away after two days.  You vomit.  You cannot drink fluids without vomiting.  You have any of the following: ? New symptoms. ? A fever. ? A headache. ? Muscle cramps. ? A rash. ? Pain while urinating.  You feel light-headed or dizzy. Get help right away if:  You have pain in your chest, neck, arm, or jaw.  You feel extremely weak or you faint.  You have vomit that is bright red or looks like coffee grounds.  You have bloody or black stools or stools that look like tar.  You have a severe headache, a stiff neck, or both.  You have severe pain, cramping, or bloating in your abdomen.  You have difficulty breathing or are breathing very quickly.  Your heart is beating very quickly.  Your skin feels cold and clammy.  You feel confused.  You have signs of dehydration, such as: ? Dark urine, very little urine, or no urine. ? Cracked lips. ? Dry mouth. ? Sunken eyes. ? Sleepiness. ? Weakness. These symptoms may represent a serious problem that is an emergency. Do not wait to see if the symptoms will  go away. Get medical help right away. Call your local emergency services (911 in the [place] Do not drive yourself to the hospital. Summary  Nausea is the feeling that you have an upset stomach or that you are about to vomit. Nausea on its own is not usually a serious concern, but it may be an early sign of a more serious medical problem.  If vomiting develops, or if you are not able to drink enough fluids, you are at risk of becoming dehydrated.  Follow recommendations for eating and drinking and take over-the-counter  and prescription medicines only as told by your health care provider.  Contact a health care provider right away if your symptoms worsen or you have new symptoms.  Keep all follow-up visits as told by your health care provider. This is important. This information is not intended to replace advice given to you by your health care provider. Make sure you discuss any questions you have with your health care provider. Document Released: 06/22/2004 Document Revised: 10/23/2017 Document Reviewed: 10/23/2017 Elsevier Interactive Patient Education  2019 [employer]

## 2018-07-02 ENCOUNTER — Other Ambulatory Visit: Payer: Self-pay | Admitting: Family Medicine

## 2018-08-12 ENCOUNTER — Emergency Department (HOSPITAL_COMMUNITY)
Admission: EM | Admit: 2018-08-12 | Discharge: 2018-08-12 | Disposition: A | Discharge status: Home / Self Care | Payer: Self-pay | Attending: Emergency Medicine | Admitting: Emergency Medicine

## 2018-08-12 ENCOUNTER — Telehealth: Payer: Self-pay

## 2018-08-12 ENCOUNTER — Encounter (HOSPITAL_COMMUNITY): Payer: Self-pay | Admitting: Emergency Medicine

## 2018-08-12 ENCOUNTER — Other Ambulatory Visit: Payer: Self-pay

## 2018-08-12 ENCOUNTER — Emergency Department (HOSPITAL_COMMUNITY): Payer: Self-pay

## 2018-08-12 DIAGNOSIS — J189 Pneumonia, unspecified organism: Secondary | ICD-10-CM | POA: Insufficient documentation

## 2018-08-12 DIAGNOSIS — B348 Other viral infections of unspecified site: Secondary | ICD-10-CM

## 2018-08-12 DIAGNOSIS — B9781 Human metapneumovirus as the cause of diseases classified elsewhere: Secondary | ICD-10-CM | POA: Insufficient documentation

## 2018-08-12 DIAGNOSIS — Z79899 Other long term (current) drug therapy: Secondary | ICD-10-CM | POA: Insufficient documentation

## 2018-08-12 DIAGNOSIS — E039 Hypothyroidism, unspecified: Secondary | ICD-10-CM | POA: Insufficient documentation

## 2018-08-12 LAB — CBC WITH DIFFERENTIAL/PLATELET
Abs Immature Granulocytes: 0.01 10*3/uL (ref 0.00–0.07)
Basophils Absolute: 0 10*3/uL (ref 0.0–0.1)
Basophils Relative: 1 %
Eosinophils Absolute: 0 10*3/uL (ref 0.0–0.5)
Eosinophils Relative: 1 %
HCT: 42.8 % (ref 36.0–46.0)
Hemoglobin: 14.1 g/dL (ref 12.0–15.0)
Immature Granulocytes: 0 %
Lymphocytes Relative: 16 %
Lymphs Abs: 0.9 10*3/uL (ref 0.7–4.0)
MCH: 30.7 pg (ref 26.0–34.0)
MCHC: 32.9 g/dL (ref 30.0–36.0)
MCV: 93 fL (ref 80.0–100.0)
Monocytes Absolute: 0.4 10*3/uL (ref 0.1–1.0)
Monocytes Relative: 7 %
Neutro Abs: 4 10*3/uL (ref 1.7–7.7)
Neutrophils Relative %: 75 %
Platelets: 277 10*3/uL (ref 150–400)
RBC: 4.6 MIL/uL (ref 3.87–5.11)
RDW: 13.2 % (ref 11.5–15.5)
WBC: 5.3 10*3/uL (ref 4.0–10.5)
nRBC: 0 % (ref 0.0–0.2)

## 2018-08-12 LAB — BASIC METABOLIC PANEL
Anion gap: 11 (ref 5–15)
BUN: 11 mg/dL (ref 6–20)
CALCIUM: 9.5 mg/dL (ref 8.9–10.3)
CO2: 23 mmol/L (ref 22–32)
CREATININE: 0.8 mg/dL (ref 0.44–1.00)
Chloride: 105 mmol/L (ref 98–111)
GFR calc Af Amer: >60 mL/min (ref >60)
GFR calc non Af Amer: >60 mL/min (ref >60)
Glucose, Bld: 96 mg/dL (ref 70–99)
Potassium: 3.7 mmol/L (ref 3.5–5.1)
SODIUM: 139 mmol/L (ref 135–145)

## 2018-08-12 LAB — RESPIRATORY PANEL BY PCR
Adenovirus: NOT DETECTED
Bordetella pertussis: NOT DETECTED
CORONAVIRUS NL63-RVPPCR: NOT DETECTED
Chlamydophila pneumoniae: NOT DETECTED
Coronavirus 229E: NOT DETECTED
Coronavirus HKU1: NOT DETECTED
Coronavirus OC43: NOT DETECTED
Influenza A: NOT DETECTED
Influenza B: NOT DETECTED
Metapneumovirus: DETECTED — AB
Mycoplasma pneumoniae: NOT DETECTED
PARAINFLUENZA VIRUS 1-RVPPCR: NOT DETECTED
Parainfluenza Virus 2: NOT DETECTED
Parainfluenza Virus 3: NOT DETECTED
Parainfluenza Virus 4: NOT DETECTED
Respiratory Syncytial Virus: NOT DETECTED
Rhinovirus / Enterovirus: NOT DETECTED

## 2018-08-12 LAB — I-STAT BETA HCG BLOOD, ED (MC, WL, AP ONLY): I-stat hCG, quantitative: <5 m[IU]/mL (ref <5)

## 2018-08-12 LAB — I-STAT TROPONIN, ED: Troponin i, poc: 0 ng/mL (ref 0.00–0.08)

## 2018-08-12 MED ORDER — BENZONATATE 100 MG PO CAPS: 100.0000 mg | ORAL_CAPSULE | Freq: Three times a day (TID) | ORAL | 0 refills | Status: AC

## 2018-08-12 MED ORDER — LEVOFLOXACIN IN D5W 750 MG/150ML IV SOLN
750.0000 mg | Freq: Once | INTRAVENOUS | Status: AC
  Administered 2018-08-12: 750 mg via INTRAVENOUS
  Filled 2018-08-12: qty 150

## 2018-08-12 MED ORDER — SODIUM CHLORIDE 0.9 % IV BOLUS
1000.0000 mL | Freq: Once | INTRAVENOUS | Status: AC
  Administered 2018-08-12: 1000 mL via INTRAVENOUS

## 2018-08-12 MED ORDER — IBUPROFEN 400 MG PO TABS
600.0000 mg | ORAL_TABLET | Freq: Once | ORAL | Status: AC
  Administered 2018-08-12: 600 mg via ORAL
  Filled 2018-08-12: qty 1

## 2018-08-12 MED ORDER — AEROCHAMBER PLUS FLO-VU MEDIUM MISC
1.0000 | Freq: Once | Status: DC
  Filled 2018-08-12: qty 1

## 2018-08-12 MED ORDER — ALBUTEROL SULFATE HFA 108 (90 BASE) MCG/ACT IN AERS
2.0000 | INHALATION_SPRAY | Freq: Once | RESPIRATORY_TRACT | Status: AC
  Administered 2018-08-12: 2 via RESPIRATORY_TRACT
  Filled 2018-08-12: qty 6.7

## 2018-08-12 MED ORDER — DOXYCYCLINE HYCLATE 100 MG PO CAPS: 100.0000 mg | ORAL_CAPSULE | Freq: Two times a day (BID) | ORAL | 0 refills | Status: AC

## 2018-08-12 NOTE — ED Triage Notes (Signed)
Pt with c/o of worsening symptoms of cough, shortness of breath and chills. She believes she had a low grade fever of 100.2-99.8. Seen at [facility] on Saturday given prednisone, without relief.   She states she was in contact with a client onFeb 11th who had recently been in [place]. That client had the flu.

## 2018-08-12 NOTE — ED Notes (Signed)
Communication with IP-Angie: Order for resp and flu swab if + continue with care if - negative call back at [phone]

## 2018-08-12 NOTE — Telephone Encounter (Signed)
          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  During this illness, did/does the patient experience any of the following symptoms? Fever >100.74F [x]   Yes []   No []   Unknown Subjective fever (felt feverish) [x]   Yes []   No []   Unknown Chills [x]   Yes []   No []   Unknown Muscle aches (myalgia) [x]   Yes []   No []   Unknown Runny nose (rhinorrhea) [x]   Yes []   No []   Unknown Sore throat [x]   Yes []   No []   Unknown Cough (new onset or worsening of chronic cough) [x]   Yes []   No []   Unknown Shortness of breath (dyspnea) [x]   Yes []   No []   Unknown Nausea or vomiting [x]   Yes []   No []   Unknown Headache [x]   Yes []   No []   Unknown Abdominal pain  []   Yes [x]   No []   Unknown Diarrhea (?3 loose/looser than normal stools/24hr period) []   Yes [x]   No []   Unknown Other, specify:_____________________________________________   Patient risk factors: Smoker? []   Current []   Former [x]   Never If female, currently pregnant? []   Yes [x]   No  Patient Active Problem List   Diagnosis Date Noted  . Vitamin D deficiency 08/07/2017  . Constipation 08/07/2017  . Anxiety 08/07/2017  . Situational depression 08/07/2017  . Herpes simplex labialis 08/07/2017  . Hypothyroidism 08/07/2017    Plan:  [x]   High risk for COVID-19 with red flags go to ED (with CP, SOB, weak/lightheaded, or fever > 101.5). Call ahead.  []   High risk for COVID-19 but stable will have car visit. Inform provider and coordinate time. Will be completed in afternoon. []   No red flags but URI signs or symptoms will go through side door and be seen in dedicated room.  Note: Referral to telemedicine is an appropriate alternative disposition for higher risk but stable. [facility]/e-Visit: [phone].

## 2018-08-12 NOTE — Telephone Encounter (Signed)
Erroneous encounter-disregard

## 2018-08-12 NOTE — ED Provider Notes (Signed)
[facility] EMERGENCY DEPARTMENT Provider Note   CSN: [id number] Arrival date & time: 08/12/18  1533    History   Chief Complaint Chief Complaint  Patient presents with  . Shortness of Breath  . Cough    HPI Jessica Juarez is a 48 y.o. female.     HPI   Patient is a 48 year old female with history of anxiety, mitral valve prolapse, who presents the emergency department today for evaluation of cough, shortness of breath and chest heaviness.  Patient states she started to have a dry cough about 1 week ago.  Since then the cough is been productive of sputum.  She also has associated rhinorrhea, nasal congestion and sore throat.  She has had associated fatigue, body aches, sweats, chills and low-grade fevers.  She was seen at urgent care 3 days ago and received prednisone, Tessalon Perles which she has taken with minimal relief. States chest heaviness is located to entire chest wall. Has stabbing pain with inspiration.  She denies any recent foreign travel or contacts with known diagnosis of coronavirus.  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  Said client became ill with upper respiratory illness.  This person did not seek medical care and she is unsure if the patient was diagnosed with the flu however person has since recovered. She has not been back to this home since 2/11.  She notes her husband, who is at bedside, has similar sxs. He has been taking prednisone with some relief.   Past Medical History:  Diagnosis Date  . Anxiety   . Heart murmur     Patient Active Problem List   Diagnosis Date Noted  . Vitamin D deficiency 08/07/2017  . Constipation 08/07/2017  . Anxiety 08/07/2017  . Situational depression 08/07/2017  . Herpes simplex labialis 08/07/2017  . Hypothyroidism 08/07/2017    Past Surgical History:  Procedure Laterality Date  . CESAREAN  SECTION    . EYE SURGERY    . TUBAL LIGATION    . WISDOM TOOTH EXTRACTION       OB History   No obstetric history on file.      Home Medications    Prior to Admission medications   Medication Sig Start Date End Date Taking? Authorizing Provider  cyclobenzaprine (FLEXERIL) 5 MG tablet TAKE 0.5 TABLETS (2.5 MG TOTAL) BY MOUTH AT BEDTIME. Patient taking differently: Take 2.5 mg by mouth at bedtime as needed.  10/23/17   Jarold Motto, PA  doxycycline (VIBRAMYCIN) 100 MG capsule Take 1 capsule (100 mg total) by mouth 2 (two) times daily for 7 days. 08/12/18 08/19/18  Antonetta Clanton S, PA-C  levothyroxine (SYNTHROID, LEVOTHROID) 50 MCG tablet TAKE 1 TABLET BY MOUTH EVERY DAY 07/02/18   Jarold Motto, PA  LORazepam (ATIVAN) 1 MG tablet Take 1 tablet (1 mg total) by mouth at bedtime. 06/24/18   Jarold Motto, PA  ondansetron (ZOFRAN) 4 MG tablet Take 1 tablet (4 mg total) by mouth every 8 (eight) hours as needed for nausea or vomiting. 06/24/18   Jarold Motto, PA  oseltamivir (TAMIFLU) 75 MG capsule Take 1 capsule (75 mg total) by mouth 2 (two) times daily. 06/24/18   Jarold Motto, PA  sertraline (ZOLOFT) 50 MG tablet Take 1 tablet (50 mg total) by mouth daily. 03/25/18   Jarold Motto, PA  valACYclovir (VALTREX) 1000 MG tablet Take two tablets ( total  2000 mg) by mouth q12h x 1 day; Start: ASAP after symptom onset 08/07/17   Jarold MottoWorley, Samantha, PA  Vitamin D, Ergocalciferol, 2000 units CAPS Take 3 capsules by mouth daily.     [provider]    Family History Family History  Problem Relation Age of Onset  . Diabetes Mother   . Depression Mother   . Colon cancer Father        4664  . Liver cancer Father   . Thyroid disease Brother   . Migraines Son   . Diabetes Maternal Grandmother   . Colon cancer Paternal Grandfather     Social History Social History   Tobacco Use  . Smoking status: Never Smoker  . Smokeless tobacco: Never Used  Substance Use Topics  .  Alcohol use: Yes  . Drug use: No     Allergies   Keflex [cephalexin] and Azithromycin   Review of Systems Review of Systems  Constitutional: Negative for chills and fever.  HENT: Negative for ear pain and sore throat.   Eyes: Negative for pain and visual disturbance.  Respiratory: Positive for cough and shortness of breath.   Cardiovascular: Positive for chest pain. Negative for palpitations and leg swelling.  Gastrointestinal: Negative for abdominal pain, constipation, diarrhea, nausea and vomiting.  Genitourinary: Negative for dysuria and hematuria.  Musculoskeletal: Negative for back pain.  Skin: Negative for color change and rash.  Neurological: Negative for seizures and syncope.  All other systems reviewed and are negative.    Physical Exam Updated Vital Signs BP 106/68   Pulse 82   Temp 99.8 F (37.7 C) (Oral)   Resp 18   SpO2 100%   Physical Exam Vitals signs and nursing note reviewed.  Constitutional:      General: She is not in acute distress.    Appearance: She is well-developed. She is not ill-appearing or toxic-appearing.  HENT:     Head: Normocephalic and atraumatic.  Eyes:     Conjunctiva/sclera: Conjunctivae normal.  Neck:     Musculoskeletal: Neck supple.  Cardiovascular:     Rate and Rhythm: Regular rhythm.     Heart sounds: No murmur.     Comments: midly tachycardia with HR around 100-105 on monitor Pulmonary:     Effort: Pulmonary effort is normal. No respiratory distress.     Breath sounds: Normal breath sounds. No decreased breath sounds, wheezing, rhonchi or rales.     Comments: Not tachypneic. Speaking in full sentences.  Chest:     Chest wall: No tenderness.  Abdominal:     General: Bowel sounds are normal.     Palpations: Abdomen is soft.     Tenderness: There is no abdominal tenderness. There is no guarding or rebound.  Skin:    General: Skin is warm and dry.  Neurological:     Mental Status: She is alert.      ED Treatments  / Results  Labs (all labs ordered are listed, but only abnormal results are displayed) Labs Reviewed  RESPIRATORY PANEL BY PCR - Abnormal; Notable for the following components:      Result Value   Metapneumovirus DETECTED (*)    All other components within normal limits  CBC WITH DIFFERENTIAL/PLATELET  BASIC METABOLIC PANEL  I-STAT TROPONIN, ED  I-STAT BETA HCG BLOOD, ED ([facility], [facility], [facility] ONLY)    EKG EKG Interpretation  Date/Time:  Monday August 12 2018 15:47:37 EDT Ventricular Rate:  114 PR Interval:  126 QRS Duration: 64 QT Interval:  310 QTC Calculation: 427 R Axis:   -39 Text Interpretation:  Sinus tachycardia Left axis deviation Septal infarct , age undetermined Abnormal ECG No old tracing to compare Confirmed by Linwood Dibbles [phone]) on 08/12/2018 4:11:02 PM   Radiology Dg Chest Portable 1 View  Result Date: 08/12/2018 CLINICAL DATA:  Pt with c/o of worsening symptoms of cough, shortness of breath and chills. She believes she had a low grade fever of 100.2-99.8. Seen at [facility] on Saturday given prednisone, without relief. EXAM: PORTABLE CHEST 1 VIEW COMPARISON:  None. FINDINGS: Normal mediastinum and cardiac silhouette. Streaky and reticular density in the LEFT lower lobe with central mild nodularity. No focal consolidation. No pleural fluid. No pulmonary edema. No clear nodularity. Prominent facets. IMPRESSION: 1. Ill-defined opacity in the LEFT lower lobe. Differential includes early pneumonia versus atelectasis. 2. Prominent nipple shadows. Electronically Signed   By: Genevive Bi M.D.   On: 08/12/2018 17:40    Procedures Procedures (including critical care time)  Medications Ordered in ED Medications  AeroChamber Plus Flo-Vu Medium MISC 1 each (1 each Other Not Given 08/12/18 1841)  levofloxacin (LEVAQUIN) IVPB 750 mg (750 mg Intravenous New Bag/Given 08/12/18 1838)  sodium chloride 0.9 % bolus 1,000 mL (0 mLs Intravenous Stopped 08/12/18 1912)  ibuprofen (ADVIL,MOTRIN) tablet  600 mg (600 mg Oral Given 08/12/18 1839)  albuterol (PROVENTIL HFA;VENTOLIN HFA) 108 (90 Base) MCG/ACT inhaler 2 puff (2 puffs Inhalation Given 08/12/18 1839)     Initial Impression / Assessment and Plan / ED Course  I have reviewed the triage vital signs and the nursing notes.  Pertinent labs & imaging results that were available during my care of the patient were reviewed by me and considered in my medical decision making (see chart for details).   Final Clinical Impressions(s) / ED Diagnoses   Final diagnoses:  Community acquired pneumonia of left lung, unspecified part of lung  Infection due to human metapneumovirus (hMPV)   Pt with URI sxs that began 1 week ago. She had exposure to someone who traveled to [place] 1 month ago and has not had contact with this per or been in their household for >67month. No other high risk exposures or foreign travel.   Boderline febrile here with temp 99.11F. Tachycardic initially at 125, however marginally tachycardic on my eval. satting well on RA Without respiratory distress. Normal BP.   CBC without leukocytosis BMP with normal electrolytes and kidney function Trop negative, pts sxs are very atypical for acs. RVP is positive for metapneumovirus.  Negative for flu A/B. Flu ordered in triage was cancelled as pts RVP shows negative flu a/b.  EKG Sinus tachycardia Left axis deviation Septal infarct , age undetermined Abnormal ECG No old tracing to compare  CXR with ill-defined opacity in the left lower lobe consistent with early pneumonia versus atelectasis.  Given the patient's clinical picture and worsening of symptoms 2 days ago, I do suspect that this is an early pneumonia.  Given that she has pneumonia on her chest x-ray and did have RVP positive for metapneumovirus, I do have low suspicion for coronavirus at this time.  Patient's possible exposure was greater than 1 month ago and I doubt that she would have such a delayed presentation.  she was given  a dose of Levaquin in the ED to treat community-acquired pneumonia, due to her allergies to Keflex and azithromycin.  She will be sent home with an albuterol inhaler, Tessalon Perles and Rx for doxycyline.  Have advised her on adverse  effects.  Advised her to discuss with her pharmacist about other medication she is taking in conjunction with this.  Advised her to follow-up with her PCP in 1 week.  Advised her not to go to work until symptoms resolve.  Strict return precautions discussed for any new or worsening symptoms in the meantime.  Any of the plan and reasons to return.  All questions answered.  Patient stable for discharge.  ED Discharge Orders         Ordered    doxycycline (VIBRAMYCIN) 100 MG capsule  2 times daily     08/12/18 1944           Rayne Du 08/12/18 1948    Linwood Dibbles, MD 08/14/18 [phone]

## 2018-08-12 NOTE — Telephone Encounter (Signed)
Called and left detailed voicemail message on patient's cell phone and requested a call back.  This is in regard to prescreening questions for her upcoming appt on 3/17 w/Samantha Worley.

## 2018-08-12 NOTE — Telephone Encounter (Signed)
In addition to COVID 19 prescreening questions from earlier, patient had also c/o a "heaviness" in her chest that was constant as well as feeling extremely fatigued and weak.    As per previous note, based on patient's answers to prescreening questions, advised patient to go directly to ED.  3/17 appt w/Samantha Bufford Buttner has been cancelled.  Pt verbalized understanding and will go to ED.

## 2018-08-12 NOTE — Discharge Instructions (Addendum)
You were given a prescription for antibiotics. Please take the antibiotic prescription fully.  Please take Tessalon Perles as directed.  Please use 2 puffs of your albuterol inhaler every 4 hours as needed for cough and shortness of breath.  Do not go to work until symptoms are resolved.   Please follow up with your primary care provider within 5-7 days for re-evaluation of your symptoms. Please return to the emergency department for any new or worsening symptoms.

## 2018-08-13 ENCOUNTER — Ambulatory Visit: Payer: Self-pay | Admitting: Physician Assistant

## 2018-08-17 ENCOUNTER — Telehealth: Payer: Self-pay

## 2018-08-17 NOTE — Telephone Encounter (Signed)
Called patient to talk about options for webex app. She states she has to have a repeat chest xray to  Be able to return to work. Informed her I would discus this with you and lea and see how we needed to handel and would be in contact with her before Monday. She asked if I recommend that she go back to ED. Informed her NO that we will call with instructions soon.

## 2018-08-18 NOTE — Telephone Encounter (Signed)
Have sent message via my chart will call patient in the am to go over with her.

## 2018-08-18 NOTE — Telephone Encounter (Signed)
Please call patient and see how she is feeling. If feeling any worse from ED visit, needs to return to ED.  Otherwise...  I would like to proceed with WebEx appointment tomorrow. I will need to discuss with the physicians how to proceed to chest x ray and should have some specifics for her by the time of her WebEx appointment.  [name]

## 2018-08-19 ENCOUNTER — Ambulatory Visit: Payer: Self-pay | Admitting: Physician Assistant

## 2018-08-19 NOTE — Telephone Encounter (Signed)
Left message on voicemail to call office.  

## 2018-09-16 ENCOUNTER — Ambulatory Visit (INDEPENDENT_AMBULATORY_CARE_PROVIDER_SITE_OTHER): Payer: 59 | Admitting: Physician Assistant

## 2018-09-16 ENCOUNTER — Encounter: Payer: Self-pay | Admitting: Physician Assistant

## 2018-09-16 DIAGNOSIS — F419 Anxiety disorder, unspecified: Secondary | ICD-10-CM

## 2018-09-16 DIAGNOSIS — F5102 Adjustment insomnia: Secondary | ICD-10-CM

## 2018-09-16 MED ORDER — SERTRALINE HCL 50 MG PO TABS: 50.0000 mg | ORAL_TABLET | Freq: Every day | ORAL | 1 refills | Status: DC

## 2018-09-16 MED ORDER — LORAZEPAM 1 MG PO TABS: 1.0000 mg | ORAL_TABLET | Freq: Every day | ORAL | 2 refills | Status: DC

## 2018-09-16 NOTE — Progress Notes (Signed)
Virtual Visit via Video   I connected with [employer]te Heck on 09/16/18 at  3:00 PM EDT by a video enabled telemedicine application and verified that I am speaking with the correct person using two identifiers. Location patient: Home Location provider: [facility], Office Persons participating in the virtual visit: Joycie Peeknnette Hewitt, Jarold MottoSamantha Shaden Higley, PA-C  I discussed the limitations of evaluation and management by telemedicine and the availability of in person appointments. The patient expressed understanding and agreed to proceed.  Subjective:   HPI:  Anxiety & Depression She is doing much better recently as she has finally returned to work after recent bout of PNA. She is sleeping well. Currently controlled with Zoloft 50 mg and Ativan prn. Denies SI/HI. Denies panic attacks.   ROS: See pertinent positives and negatives per HPI.  Patient Active Problem List   Diagnosis Date Noted  . Vitamin D deficiency 08/07/2017  . Constipation 08/07/2017  . Anxiety 08/07/2017  . Situational depression 08/07/2017  . Herpes simplex labialis 08/07/2017  . Hypothyroidism 08/07/2017    Social History   Tobacco Use  . Smoking status: Never Smoker  . Smokeless tobacco: Never Used  Substance Use Topics  . Alcohol use: Yes    Current Outpatient Medications:  .  levothyroxine (SYNTHROID, LEVOTHROID) 50 MCG tablet, TAKE 1 TABLET BY MOUTH EVERY DAY, Disp: 90 tablet, Rfl: 1 .  LORazepam (ATIVAN) 1 MG tablet, Take 1 tablet (1 mg total) by mouth at bedtime., Disp: 30 tablet, Rfl: 2 .  sertraline (ZOLOFT) 50 MG tablet, Take 1 tablet (50 mg total) by mouth daily., Disp: 90 tablet, Rfl: 1 .  valACYclovir (VALTREX) 1000 MG tablet, Take two tablets ( total 2000 mg) by mouth q12h x 1 day; Start: ASAP after symptom onset, Disp: 6 tablet, Rfl: 2 .  Vitamin D, Ergocalciferol, 2000 units CAPS, Take 3 capsules by mouth daily. , Disp: , Rfl:   Allergies  Allergen Reactions  . Keflex [Cephalexin] Hives  .  Azithromycin Palpitations    Objective:   VITALS: Per patient if applicable, see vitals. GENERAL: Alert, appears well and in no acute distress. HEENT: Atraumatic, conjunctiva clear, no obvious abnormalities on inspection of external nose and ears. NECK: Normal movements of the head and neck. CARDIOPULMONARY: No increased WOB. Speaking in clear sentences. I:E ratio WNL.  MS: Moves all visible extremities without noticeable abnormality. PSYCH: Pleasant and cooperative, well-groomed. Speech normal rate and rhythm. Affect is appropriate. Insight and judgement are appropriate. Attention is focused, linear, and appropriate.  NEURO: CN grossly intact. Oriented as arrived to appointment on time with no prompting. Moves both UE equally.  SKIN: No obvious lesions, wounds, erythema, or cyanosis noted on face or hands.  Assessment and Plan:   [name] was seen today for anxiety and depression.  Diagnoses and all orders for this visit:  Anxiety  Adjustment insomnia  Other orders -     sertraline (ZOLOFT) 50 MG tablet; Take 1 tablet (50 mg total) by mouth daily. -     LORazepam (ATIVAN) 1 MG tablet; Take 1 tablet (1 mg total) by mouth at bedtime.   [place] Controlled Substance Database reviewed today regarding patient. Patient is compliant with [facility] regarding pharmacy use and one-prescribing provider. It is appropriate to continue current medication regimen. Continue Zoloft 50 mg and ativan prn.   . Reviewed expectations re: course of current medical issues. . Discussed self-management of symptoms. . Outlined signs and symptoms indicating need for more acute intervention. . Patient verbalized understanding and all  questions were answered. Marland Kitchen Health Maintenance issues including appropriate healthy diet, exercise, and smoking avoidance were discussed with patient. . See orders for this visit as documented in the electronic medical record.  I discussed the assessment and treatment plan with the  patient. The patient was provided an opportunity to ask questions and all were answered. The patient agreed with the plan and demonstrated an understanding of the instructions.   The patient was advised to call back or seek an in-person evaluation if the symptoms worsen or if the condition fails to improve as anticipated.    [place], [place] 09/16/2018

## 2018-09-16 NOTE — Telephone Encounter (Signed)
Please call patient.  Let's have a dedicated virtual visit to discuss your pneumonia follow-up and go over your short term disability paperwork. We can also schedule you to come in for labs and xray after that visit as well.  It would be best if we could have a copy of the short term disability paperwork prior to this meeting.  [name]

## 2018-09-16 NOTE — Telephone Encounter (Signed)
Spoke to pt told her [name] would like her to make a virtual visit to discuss pneumonia and short term disability paperwork. Also she will have you come in for labs and x-ray after the visit as well. Pt verbalized understanding. Asked pt if she could drop off paperwork tomorrow and then we can schedule visit for Wed. Pt said okay. Appt scheduled for Wed at 2:20 with [facility]. Pt verbalized understanding.

## 2018-09-18 ENCOUNTER — Encounter: Payer: Self-pay | Admitting: Physician Assistant

## 2018-09-18 ENCOUNTER — Ambulatory Visit (INDEPENDENT_AMBULATORY_CARE_PROVIDER_SITE_OTHER): Payer: 59 | Admitting: Physician Assistant

## 2018-09-18 DIAGNOSIS — E039 Hypothyroidism, unspecified: Secondary | ICD-10-CM

## 2018-09-18 DIAGNOSIS — J189 Pneumonia, unspecified organism: Secondary | ICD-10-CM

## 2018-09-18 DIAGNOSIS — J181 Lobar pneumonia, unspecified organism: Secondary | ICD-10-CM

## 2018-09-18 NOTE — Progress Notes (Signed)
Virtual Visit via Video   I connected with Jessica Corporationnnette Juarez on 09/18/18 at  2:20 PM EDT by a video enabled telemedicine application and verified that I am speaking with the correct person using two identifiers. Location patient: Home Location provider: [facility], Office Persons participating in the virtual visit: Joycie Peeknnette Koike, Jarold MottoSamantha Edwar Coe, [place]   I discussed the limitations of evaluation and management by telemedicine and the availability of in person appointments. The patient expressed understanding and agreed to proceed.  Subjective:   HPI:   PNA Patient was diagnosed with CAP in ED on 08/12/2018. Respiratory viral swab at that time was done and was positive for "metapneumovirus." She was treated with IV levaquin while in the ED and then given a 7 day course of doxycycline and also given tessalon perles. She was instructed to follow-up with our office 1 week after ED visit to follow-up.  Unfortunately, she was erroneously told by our office that we weren't seeing patients, during our transition to virtual visits during the beginning of COVID-19, so she is just now following up with me regarding this.  She states that she has been having significant improvement in cough. She returned to work on 09/11/2018 as all of her symptoms resolved. She was instructed by her employer that she could not return to work until she had 100% resolution of all symptoms. She was using a rescue inhaler for a few days after her ER trip. She also had lingering fever for 1-2 days after ER trip. She required use of motrin and tylenol for fever/pain.  CXR in ER showed: 1. Ill-defined opacity in the LEFT lower lobe. Differential includes early pneumonia versus atelectasis. 2. Prominent nipple shadows.   Hypothyroidism Currently on levothyroxine 50 mcg daily. Most recent TSH and free T4 checked >1 year ago and was WNL. She denies significant changes to skin, weight, hair, mood.    ROS: See pertinent positives  and negatives per HPI.  Patient Active Problem List   Diagnosis Date Noted  . Vitamin D deficiency 08/07/2017  . Constipation 08/07/2017  . Anxiety 08/07/2017  . Situational depression 08/07/2017  . Herpes simplex labialis 08/07/2017  . Hypothyroidism 08/07/2017    Social History   Tobacco Use  . Smoking status: Never Smoker  . Smokeless tobacco: Never Used  Substance Use Topics  . Alcohol use: Yes    Current Outpatient Medications:  .  levothyroxine (SYNTHROID, LEVOTHROID) 50 MCG tablet, TAKE 1 TABLET BY MOUTH EVERY DAY, Disp: 90 tablet, Rfl: 1 .  LORazepam (ATIVAN) 1 MG tablet, Take 1 tablet (1 mg total) by mouth at bedtime., Disp: 30 tablet, Rfl: 2 .  sertraline (ZOLOFT) 50 MG tablet, Take 1 tablet (50 mg total) by mouth daily., Disp: 90 tablet, Rfl: 1 .  valACYclovir (VALTREX) 1000 MG tablet, Take two tablets ( total 2000 mg) by mouth q12h x 1 day; Start: ASAP after symptom onset, Disp: 6 tablet, Rfl: 2 .  Vitamin D, Ergocalciferol, 2000 units CAPS, Take 3 capsules by mouth daily. , Disp: , Rfl:   Allergies  Allergen Reactions  . Keflex [Cephalexin] Hives  . Azithromycin Palpitations    Objective:   VITALS: Per patient if applicable, see vitals. GENERAL: Alert, appears well and in no acute distress. HEENT: Atraumatic, conjunctiva clear, no obvious abnormalities on inspection of external nose and ears. NECK: Normal movements of the head and neck. CARDIOPULMONARY: No increased WOB. Speaking in clear sentences. I:E ratio WNL.  MS: Moves all visible extremities without noticeable abnormality.  PSYCH: Pleasant and cooperative, well-groomed. Speech normal rate and rhythm. Affect is appropriate. Insight and judgement are appropriate. Attention is focused, linear, and appropriate.  NEURO: CN grossly intact. Oriented as arrived to appointment on time with no prompting. Moves both UE equally.  SKIN: No obvious lesions, wounds, erythema, or cyanosis noted on face or hands.   Assessment and Plan:   Diamonds was seen today for f/u pneumonia and fmla paperwork.  Diagnoses and all orders for this visit:  Acquired hypothyroidism Clinically stable, will check labs. -     TSH; Future -     T4, free; Future  Community acquired pneumonia of left lower lobe of lung (HCC) Overall significantly improved. Will repeat chest xray to ensure resolution of symptoms. Also repeat CMP and CBC. Follow-up if any symptoms persist. Short term disability forms completed at today's visit. -     DG Chest 2 View; Future -     CBC with Differential/Platelet; Future -     Comprehensive metabolic panel; Future    . Reviewed expectations re: course of current medical issues. . Discussed self-management of symptoms. . Outlined signs and symptoms indicating need for more acute intervention. . Patient verbalized understanding and all questions were answered. Marland Kitchen Health Maintenance issues including appropriate healthy diet, exercise, and smoking avoidance were discussed with patient. . See orders for this visit as documented in the electronic medical record.  I discussed the assessment and treatment plan with the patient. The patient was provided an opportunity to ask questions and all were answered. The patient agreed with the plan and demonstrated an understanding of the instructions.   The patient was advised to call back or seek an in-person evaluation if the symptoms worsen or if the condition fails to improve as anticipated.     [place], [place] 09/18/2018

## 2018-09-19 ENCOUNTER — Ambulatory Visit (INDEPENDENT_AMBULATORY_CARE_PROVIDER_SITE_OTHER): Payer: Self-pay

## 2018-09-19 ENCOUNTER — Other Ambulatory Visit (INDEPENDENT_AMBULATORY_CARE_PROVIDER_SITE_OTHER): Payer: 59

## 2018-09-19 ENCOUNTER — Other Ambulatory Visit: Payer: Self-pay

## 2018-09-19 DIAGNOSIS — J181 Lobar pneumonia, unspecified organism: Secondary | ICD-10-CM

## 2018-09-19 DIAGNOSIS — E039 Hypothyroidism, unspecified: Secondary | ICD-10-CM

## 2018-09-19 DIAGNOSIS — J189 Pneumonia, unspecified organism: Secondary | ICD-10-CM

## 2018-09-19 LAB — CBC WITH DIFFERENTIAL/PLATELET
Basophils Absolute: 0.1 10*3/uL (ref 0.0–0.1)
Basophils Relative: 1 % (ref 0.0–3.0)
Eosinophils Absolute: 0.1 10*3/uL (ref 0.0–0.7)
Eosinophils Relative: 1.4 % (ref 0.0–5.0)
HCT: 39.9 % (ref 36.0–46.0)
Hemoglobin: 13.6 g/dL (ref 12.0–15.0)
Lymphocytes Relative: 34.9 % (ref 12.0–46.0)
Lymphs Abs: 1.7 10*3/uL (ref 0.7–4.0)
MCHC: 34 g/dL (ref 30.0–36.0)
MCV: 90.3 fl (ref 78.0–100.0)
Monocytes Absolute: 0.4 10*3/uL (ref 0.1–1.0)
Monocytes Relative: 8.3 % (ref 3.0–12.0)
Neutro Abs: 2.7 10*3/uL (ref 1.4–7.7)
Neutrophils Relative %: 54.4 % (ref 43.0–77.0)
Platelets: 315 10*3/uL (ref 150.0–400.0)
RBC: 4.41 Mil/uL (ref 3.87–5.11)
RDW: 13.9 % (ref 11.5–15.5)
WBC: 4.9 10*3/uL (ref 4.0–10.5)

## 2018-09-19 LAB — COMPREHENSIVE METABOLIC PANEL
ALT: 13 U/L (ref 0–35)
AST: 14 U/L (ref 0–37)
Albumin: 4.8 g/dL (ref 3.5–5.2)
Alkaline Phosphatase: 67 U/L (ref 39–117)
BUN: 19 mg/dL (ref 6–23)
CO2: 28 mEq/L (ref 19–32)
Calcium: 9.8 mg/dL (ref 8.4–10.5)
Chloride: 102 mEq/L (ref 96–112)
Creatinine, Ser: 0.77 mg/dL (ref 0.40–1.20)
GFR: 79.34 mL/min (ref >60.00)
Glucose, Bld: 88 mg/dL (ref 70–99)
Potassium: 3.9 mEq/L (ref 3.5–5.1)
Sodium: 139 mEq/L (ref 135–145)
Total Bilirubin: 0.7 mg/dL (ref 0.2–1.2)
Total Protein: 7.4 g/dL (ref 6.0–8.3)

## 2018-09-19 LAB — TSH: TSH: 14.94 u[IU]/mL — ABNORMAL HIGH (ref 0.35–4.50)

## 2018-09-19 LAB — T4, FREE: Free T4: 0.62 ng/dL (ref 0.60–1.60)

## 2018-09-20 ENCOUNTER — Other Ambulatory Visit: Payer: Self-pay | Admitting: *Deleted

## 2018-09-20 ENCOUNTER — Other Ambulatory Visit: Payer: Self-pay | Admitting: Physician Assistant

## 2018-09-20 DIAGNOSIS — E039 Hypothyroidism, unspecified: Secondary | ICD-10-CM

## 2018-09-20 MED ORDER — LEVOTHYROXINE SODIUM 88 MCG PO TABS: 88.0000 ug | ORAL_TABLET | Freq: Every day | ORAL | 1 refills | Status: DC

## 2018-09-24 ENCOUNTER — Other Ambulatory Visit: Payer: Self-pay | Admitting: *Deleted

## 2018-09-24 MED ORDER — LEVOTHYROXINE SODIUM 88 MCG PO TABS: 88.0000 ug | ORAL_TABLET | Freq: Every day | ORAL | 0 refills | Status: DC

## 2018-11-11 ENCOUNTER — Other Ambulatory Visit: Payer: 59

## 2018-12-12 ENCOUNTER — Ambulatory Visit: Payer: 59 | Admitting: Physician Assistant

## 2018-12-12 NOTE — Progress Notes (Signed)
Patient had to cancel her appointment.  No charge.

## 2018-12-16 ENCOUNTER — Encounter: Payer: Self-pay | Admitting: Physician Assistant

## 2018-12-16 ENCOUNTER — Ambulatory Visit (INDEPENDENT_AMBULATORY_CARE_PROVIDER_SITE_OTHER): Payer: 59 | Admitting: Physician Assistant

## 2018-12-16 DIAGNOSIS — F419 Anxiety disorder, unspecified: Secondary | ICD-10-CM | POA: Diagnosis not present

## 2018-12-16 DIAGNOSIS — B001 Herpesviral vesicular dermatitis: Secondary | ICD-10-CM

## 2018-12-16 DIAGNOSIS — Z20822 Contact with and (suspected) exposure to covid-19: Secondary | ICD-10-CM

## 2018-12-16 DIAGNOSIS — E039 Hypothyroidism, unspecified: Secondary | ICD-10-CM | POA: Diagnosis not present

## 2018-12-16 DIAGNOSIS — Z20828 Contact with and (suspected) exposure to other viral communicable diseases: Secondary | ICD-10-CM

## 2018-12-16 MED ORDER — LORAZEPAM 1 MG PO TABS: 1.0000 mg | ORAL_TABLET | Freq: Every day | ORAL | 2 refills | Status: DC

## 2018-12-16 MED ORDER — VALACYCLOVIR HCL 1 G PO TABS: ORAL_TABLET | ORAL | 2 refills | Status: DC

## 2018-12-16 NOTE — Progress Notes (Signed)
Virtual Visit via Video   I connected with Jessica Juarez on 12/16/18 at  3:40 PM EDT by a video enabled telemedicine application and verified that I am speaking with the correct person using two identifiers. Location patient: Home Location provider: [facility], Office Persons participating in the virtual visit: Jessica Juarez, Altria GroupSamantha Kareli Hossain PA-C.  I discussed the limitations of evaluation and management by telemedicine and the availability of in person appointments. The patient expressed understanding and agreed to proceed.  I acted as a Neurosurgeonscribe for [employer] CorporationSamantha Forrestine Lecrone, Avon ProductsPA-C Donna Orphanos, LPN  Subjective:   HPI:   Hypothyroidism Pt following up today. Currently taking Levothyroxine 88 mcg daily was increased (from 50 mcg) 4/23 due to TSH 14.94. Currently 107.5 lb  Wt Readings from Last 5 Encounters:  06/24/18 100 lb 8 oz (45.6 kg)  03/25/18 102 lb (46.3 kg)  12/24/17 97 lb 4 oz (44.1 kg)  09/19/17 102 lb 4 oz (46.4 kg)  08/07/17 105 lb 6.1 oz (47.8 kg)   Anxiety & Depression Pt following up today. She doing well. Currently taking Zoloft 50 mg daily and Lorazepam 1 mg at bedtime. Denies symptoms of SI/HI or panic attacks.  Herpes Would like refill on valtrex. Currently not having an outbreak, but would like medication on hand if needed.  COVID-19 antibody testing She is interested in COVID-19 antibody testing. Thinks that she may have either had or been exposed to it earlier this year. Currently without symptoms.  ROS: See pertinent positives and negatives per HPI.  Patient Active Problem List   Diagnosis Date Noted  . Vitamin D deficiency 08/07/2017  . Constipation 08/07/2017  . Anxiety 08/07/2017  . Situational depression 08/07/2017  . Herpes simplex labialis 08/07/2017  . Hypothyroidism 08/07/2017    Social History   Tobacco Use  . Smoking status: Never Smoker  . Smokeless tobacco: Never Used  Substance Use Topics  . Alcohol use: Yes    Current Outpatient  Medications:  .  levothyroxine (SYNTHROID) 88 MCG tablet, Take 1 tablet (88 mcg total) by mouth daily., Disp: 90 tablet, Rfl: 0 .  LORazepam (ATIVAN) 1 MG tablet, Take 1 tablet (1 mg total) by mouth at bedtime., Disp: 30 tablet, Rfl: 2 .  sertraline (ZOLOFT) 50 MG tablet, Take 1 tablet (50 mg total) by mouth daily., Disp: 90 tablet, Rfl: 1 .  valACYclovir (VALTREX) 1000 MG tablet, Take two tablets ( total 2000 mg) by mouth q12h x 1 day; Start: ASAP after symptom onset, Disp: 6 tablet, Rfl: 2 .  Vitamin D, Ergocalciferol, 2000 units CAPS, Take 3 capsules by mouth daily. , Disp: , Rfl:   Allergies  Allergen Reactions  . Keflex [Cephalexin] Hives  . Azithromycin Palpitations    Objective:   VITALS: Per patient if applicable, see vitals. GENERAL: Alert, appears well and in no acute distress. HEENT: Atraumatic, conjunctiva clear, no obvious abnormalities on inspection of external nose and ears. NECK: Normal movements of the head and neck. CARDIOPULMONARY: No increased WOB. Speaking in clear sentences. I:E ratio WNL.  MS: Moves all visible extremities without noticeable abnormality. PSYCH: Pleasant and cooperative, well-groomed. Speech normal rate and rhythm. Affect is appropriate. Insight and judgement are appropriate. Attention is focused, linear, and appropriate.  NEURO: CN grossly intact. Oriented as arrived to appointment on time with no prompting. Moves both UE equally.  SKIN: No obvious lesions, wounds, erythema, or cyanosis noted on face or hands.  Assessment and Plan:   [name] was seen today for hypothyroidism, anxiety and  depression.  Diagnoses and all orders for this visit:  Anxiety Well controlled on Zoloft 50 mg and Ativan 1 mg. [place] Controlled Substance Database reviewed today regarding patient. Patient is compliant with [facility] regarding pharmacy use and one-prescribing provider. It is appropriate to continue current medication regimen.   Hypothyroidism, unspecified type Coming  in for labs later this week. Will adjust dosage prn.  Herpes simplex labialis Refill medication.  Close Exposure to Covid-19 Virus Discussed risk/benefit of testing and interpretation of lab. She will consider this.  . Reviewed expectations re: course of current medical issues. . Discussed self-management of symptoms. . Outlined signs and symptoms indicating need for more acute intervention. . Patient verbalized understanding and all questions were answered. Marland Kitchen Health Maintenance issues including appropriate healthy diet, exercise, and smoking avoidance were discussed with patient. . See orders for this visit as documented in the electronic medical record.  I discussed the assessment and treatment plan with the patient. The patient was provided an opportunity to ask questions and all were answered. The patient agreed with the plan and demonstrated an understanding of the instructions.   The patient was advised to call back or seek an in-person evaluation if the symptoms worsen or if the condition fails to improve as anticipated.   CMA or LPN served as scribe during this visit. History, Physical, and Plan performed by medical provider. The above documentation has been reviewed and is accurate and complete.   [place], [place] 12/16/2018

## 2018-12-19 ENCOUNTER — Other Ambulatory Visit: Payer: 59

## 2019-02-18 ENCOUNTER — Encounter: Payer: Self-pay | Admitting: Physician Assistant

## 2019-02-18 ENCOUNTER — Other Ambulatory Visit: Payer: Self-pay

## 2019-02-18 ENCOUNTER — Ambulatory Visit (INDEPENDENT_AMBULATORY_CARE_PROVIDER_SITE_OTHER): Payer: 59 | Admitting: Physician Assistant

## 2019-02-18 VITALS — BP 102/70 | HR 69 | Temp 98.1°F | Ht <= 58 in | Wt 107.0 lb

## 2019-02-18 DIAGNOSIS — H9201 Otalgia, right ear: Secondary | ICD-10-CM | POA: Diagnosis not present

## 2019-02-18 MED ORDER — FLUTICASONE PROPIONATE 50 MCG/ACT NA SUSP: 2.0000 | Freq: Every day | NASAL | 2 refills | Status: DC

## 2019-02-18 NOTE — Progress Notes (Signed)
Jessica Juarez is a 48 y.o. female here for a new problem.  I acted as a Neurosurgeon for [employer], PA-C Corky Mull, LPN  History of Present Illness:   Chief Complaint  Patient presents with  . Otalgia    HPI    Otalgia Pt c/o right ear pain and ringing in ear x 2 days. No drainage from ear. Having headache, fatigue and some chills. Taking Tylenol with relief.  She was around family and other people, this past weekend, but denies any other known contacts.  Denies: Loss of taste/smell, diarrhea, cough, chest pain, shortness of breath    Past Medical History:  Diagnosis Date  . Anxiety   . Heart murmur      Social History   Socioeconomic History  . Marital status: Married    Spouse name: Not on file  . Number of children: Not on file  . Years of education: Not on file  . Highest education level: Not on file  Occupational History  . Not on file  Social Needs  . Financial resource strain: Not on file  . Food insecurity    Worry: Not on file    Inability: Not on file  . Transportation needs    Medical: Not on file    Non-medical: Not on file  Tobacco Use  . Smoking status: Never Smoker  . Smokeless tobacco: Never Used  Substance and Sexual Activity  . Alcohol use: Yes  . Drug use: No  . Sexual activity: Yes  Lifestyle  . Physical activity    Days per week: Not on file    Minutes per session: Not on file  . Stress: Not on file  Relationships  . Social Musician on phone: Not on file    Gets together: Not on file    Attends religious service: Not on file    Active member of club or organization: Not on file    Attends meetings of clubs or organizations: Not on file    Relationship status: Not on file  . Intimate partner violence    Fear of current or ex partner: Not on file    Emotionally abused: Not on file    Physically abused: Not on file    Forced sexual activity: Not on file  Other Topics Concern  . 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 CESAREAN SECTION    . EYE SURGERY    . TUBAL LIGATION    . WISDOM TOOTH EXTRACTION      Family History  Problem Relation Age of Onset  . Diabetes Mother   . Depression Mother   . Colon cancer Father        73  . Liver cancer Father   . Thyroid disease Brother   . Migraines Son   . Diabetes Maternal Grandmother   . Colon cancer Paternal Grandfather     Allergies  Allergen Reactions  . Erythromycin Palpitations  . Keflex [Cephalexin] Hives    Current Medications:   Current Outpatient Medications:  .  levothyroxine (SYNTHROID) 50 MCG tablet, Take 50 mcg by mouth daily., Disp: , Rfl:  .  LORazepam (ATIVAN) 1 MG tablet, Take 1 tablet (1 mg total) by mouth at bedtime., Disp: 30 tablet, Rfl: 2 .  sertraline (ZOLOFT) 50 MG tablet, Take 1 tablet (50 mg total) by mouth  daily., Disp: 90 tablet, Rfl: 1 .  valACYclovir (VALTREX) 1000 MG tablet, Take two tablets ( total 2000 mg) by mouth q12h x 1 day; Start: ASAP after symptom onset, Disp: 6 tablet, Rfl: 2 .  Vitamin D, Ergocalciferol, 2000 units CAPS, Take 3 capsules by mouth daily. , Disp: , Rfl:  .  fluticasone (FLONASE) 50 MCG/ACT nasal spray, Place 2 sprays into both nostrils daily., Disp: 16 g, Rfl: 2   Review of Systems:   ROS Negative unless otherwise specified per HPI.  Vitals:   Vitals:   02/18/19 1509  BP: 102/70  Pulse: 69  Temp: 98.1 F (36.7 C)  TempSrc: Temporal  SpO2: 97%  Weight: 107 lb (48.5 kg)  Height: 4\' 10"  (1.473 m)     Body mass index is 22.36 kg/m.  Physical Exam:   Physical Exam Vitals signs and nursing note reviewed.  Constitutional:      General: She is not in acute distress.    Appearance: She is well-developed. She is not ill-appearing or toxic-appearing.  HENT:     Head: Normocephalic and atraumatic.     Right Ear: Tympanic membrane, ear canal and external ear normal. There  is impacted cerumen.     Left Ear: Tympanic membrane, ear canal and external ear normal. Tympanic membrane is not erythematous, retracted or bulging.     Nose: Nose normal.     Right Sinus: No maxillary sinus tenderness or frontal sinus tenderness.     Left Sinus: No maxillary sinus tenderness or frontal sinus tenderness.     Mouth/Throat:     Pharynx: Uvula midline. No posterior oropharyngeal erythema.  Eyes:     General: Lids are normal.     Conjunctiva/sclera: Conjunctivae normal.  Neck:     Trachea: Trachea normal.  Cardiovascular:     Rate and Rhythm: Normal rate and regular rhythm.     Heart sounds: Normal heart sounds, S1 normal and S2 normal.  Pulmonary:     Effort: Pulmonary effort is normal.     Breath sounds: Normal breath sounds. No decreased breath sounds, wheezing, rhonchi or rales.  Lymphadenopathy:     Cervical: No cervical adenopathy.  Skin:    General: Skin is warm and dry.  Neurological:     Mental Status: She is alert.  Psychiatric:        Speech: Speech normal.        Behavior: Behavior normal. Behavior is cooperative.      Assessment and Plan:   Melanny was seen today for otalgia.  Diagnoses and all orders for this visit:  Right ear pain -     Novel Coronavirus, NAA ([facility])  Other orders -     fluticasone (FLONASE) 50 MCG/ACT nasal spray; Place 2 sprays into both nostrils daily.   Patient has a respiratory illness without signs of acute distress or respiratory compromise at this time. This is likely a viral infection, which can come from a number of respiratory viruses. We are going to send patient for drive-up testing. As a precaution, they have been advised to remain home until COVID-19 results and then possible further quarantine after that based on results and symptoms. Advised if they experience a "second sickening" or worsening symptoms as the illness progresses, they are to call the office for further instructions or seek emergent evaluation  for any severe symptoms.   Will treat for eustachian tube dysfunction with Flonase.  . Reviewed expectations re: course of current medical issues. . Discussed self-management of  symptoms. . Outlined signs and symptoms indicating need for more acute intervention. . Patient verbalized understanding and all questions were answered. . See orders for this visit as documented in the electronic medical record. . Patient received an After-Visit Summary.  CMA or LPN served as scribe during this visit. History, Physical, and Plan performed by medical provider. The above documentation has been reviewed and is accurate and complete.   Inda Coke, PA-C

## 2019-02-18 NOTE — Patient Instructions (Signed)
It was great to see you!  Start flonase nasal spray twice daily.  We have put in an order for you to be tested for COVID-19.  You do not need an appointment to go get testing -- please proceed to one of the following drive-up testing locations at your convenience. The line closes at 3:30pm daily. Test results may take up to a week to return.  [place] Location: [place], [place] (old [facility]) Hours: 8a-3:45p, M-F  It was great to see you!  You have a viral upper respiratory infection. Antibiotics are not needed for this.  Viral infections usually take 7-10 days to resolve.  The cough can last a few weeks to go away.  If you develop severe shortness of breath, uncontrolled fevers, coughing up blood, confusion, chest pain, or signs of dehydration (such as significantly decreased urine amounts or dizziness with standing) please CALL the ER and then GO to the ER.  Push fluids and get plenty of rest.  Call clinic with questions.  I hope you start feeling better soon!

## 2019-02-19 ENCOUNTER — Other Ambulatory Visit: Payer: Self-pay

## 2019-02-19 DIAGNOSIS — Z20822 Contact with and (suspected) exposure to covid-19: Secondary | ICD-10-CM

## 2019-02-21 LAB — NOVEL CORONAVIRUS, NAA: SARS-CoV-2, NAA: NOT DETECTED

## 2019-03-10 ENCOUNTER — Other Ambulatory Visit: Payer: Self-pay

## 2019-03-10 ENCOUNTER — Encounter: Payer: Self-pay | Admitting: Physician Assistant

## 2019-03-10 ENCOUNTER — Ambulatory Visit (INDEPENDENT_AMBULATORY_CARE_PROVIDER_SITE_OTHER): Payer: 59 | Admitting: Physician Assistant

## 2019-03-10 VITALS — BP 116/64 | HR 64 | Temp 98.6°F | Ht 60.0 in | Wt 106.0 lb

## 2019-03-10 DIAGNOSIS — Z23 Encounter for immunization: Secondary | ICD-10-CM

## 2019-03-10 DIAGNOSIS — F419 Anxiety disorder, unspecified: Secondary | ICD-10-CM

## 2019-03-10 DIAGNOSIS — E559 Vitamin D deficiency, unspecified: Secondary | ICD-10-CM | POA: Diagnosis not present

## 2019-03-10 DIAGNOSIS — E039 Hypothyroidism, unspecified: Secondary | ICD-10-CM | POA: Diagnosis not present

## 2019-03-10 MED ORDER — LORAZEPAM 1 MG PO TABS: 1.0000 mg | ORAL_TABLET | Freq: Every day | ORAL | 2 refills | Status: DC

## 2019-03-10 MED ORDER — SERTRALINE HCL 50 MG PO TABS: 50.0000 mg | ORAL_TABLET | Freq: Every day | ORAL | 1 refills | Status: DC

## 2019-03-10 NOTE — Progress Notes (Signed)
Jessica Juarez is a 48 y.o. female here for a follow up of a pre-existing problem.   History of Present Illness:   Chief Complaint  Patient presents with  . Hypothyroidism    HPI  Hypothyroidism --patient was increased in April from 50 mcg to 88 mcg due to her increasing TSH.  She was lost to follow-up.  She is due for follow-up today and we will recheck labs today.  She feels great, denies any changes in energy level, weight, constipation or other issues.  She is compliant with her medication takes it every morning on empty stomach at 6 AM.  Vitamin D deficiency--takes vitamin D regularly, but would like it checked today.    Anxiety --uses Ativan at night for sleeping, and is also taking sertraline 50 mg daily.  She states that her anxiety is very well controlled.  Denies any suicidal or homicidal ideations.  Feels like this is a good dose for her.  Past Medical History:  Diagnosis Date  . Anxiety   . Heart murmur      Social History   Socioeconomic History  . Marital status: Married    Spouse name: Not on file  . Number of children: Not on file  . Years of education: Not on file  . Highest education level: Not on file  Occupational History  . Not on file  Social Needs  . Financial resource strain: Not on file  . Food insecurity    Worry: Not on file    Inability: Not on file  . Transportation needs    Medical: Not on file    Non-medical: Not on file  Tobacco Use  . Smoking status: Never Smoker  . Smokeless tobacco: Never Used  Substance and Sexual Activity  . Alcohol use: Yes  . Drug use: No  . Sexual activity: Yes  Lifestyle  . Physical activity    Days per week: Not on file    Minutes per session: Not on file  . Stress: Not on file  Relationships  . Social Musician on phone: Not on file    Gets together: Not on file    Attends religious service: Not on file    Active member of club or organization: Not on file    Attends meetings of clubs or  organizations: Not on file    Relationship status: Not on file  . Intimate partner violence    Fear of current or ex partner: Not on file    Emotionally abused: Not on file    Physically abused: Not on file    Forced sexual activity: Not on file  Other Topics Concern  . 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 CESAREAN SECTION    . EYE SURGERY    . TUBAL LIGATION    . WISDOM TOOTH EXTRACTION      Family History  Problem Relation Age of Onset  . Diabetes Mother   . Depression Mother   . Colon cancer Father        18  . Liver cancer Father   . Thyroid disease Brother   . Migraines Son   . Diabetes Maternal Grandmother   . Colon cancer Paternal Grandfather     Allergies  Allergen Reactions  . Erythromycin Palpitations  . Keflex [Cephalexin] Hives    Current Medications:  Current Outpatient Medications:  .  levothyroxine (SYNTHROID) 50 MCG tablet, Take 50 mcg by mouth daily., Disp: , Rfl:  .  LORazepam (ATIVAN) 1 MG tablet, Take 1 tablet (1 mg total) by mouth at bedtime., Disp: 30 tablet, Rfl: 2 .  sertraline (ZOLOFT) 50 MG tablet, Take 1 tablet (50 mg total) by mouth daily., Disp: 90 tablet, Rfl: 1 .  valACYclovir (VALTREX) 1000 MG tablet, Take two tablets ( total 2000 mg) by mouth q12h x 1 day; Start: ASAP after symptom onset, Disp: 6 tablet, Rfl: 2 .  Vitamin D, Ergocalciferol, 2000 units CAPS, Take 3 capsules by mouth daily. , Disp: , Rfl:    Review of Systems:   ROS  Vitals:   Vitals:   03/10/19 1447  BP: 116/64  Pulse: 64  Temp: 98.6 F (37 C)  SpO2: 97%  Weight: 106 lb (48.1 kg)  Height: 5' (1.524 m)     Body mass index is 20.7 kg/m.  Physical Exam:   Physical Exam Vitals signs and nursing note reviewed.  Constitutional:      General: She is not in acute distress.    Appearance: She is well-developed. She is not ill-appearing or toxic-appearing.   Neck:     Musculoskeletal: Full passive range of motion without pain.     Thyroid: No thyroid mass, thyromegaly or thyroid tenderness.  Cardiovascular:     Rate and Rhythm: Normal rate and regular rhythm.     Pulses: Normal pulses.     Heart sounds: Normal heart sounds, S1 normal and S2 normal.     Comments: No LE edema Pulmonary:     Effort: Pulmonary effort is normal.     Breath sounds: Normal breath sounds.  Skin:    General: Skin is warm and dry.  Neurological:     Mental Status: She is alert.     GCS: GCS eye subscore is 4. GCS verbal subscore is 5. GCS motor subscore is 6.  Psychiatric:        Speech: Speech normal.        Behavior: Behavior normal. Behavior is cooperative.      Assessment and Plan:   Michayla was seen today for hypothyroidism.  Diagnoses and all orders for this visit:  Vitamin D deficiency Recheck labs per patient request. -     VITAMIN D 25 Hydroxy (Vit-D Deficiency, Fractures)  Hypothyroidism, unspecified type Recheck labs, then will refill medication based on result. -     TSH  Anxiety Continue Zoloft 50 mg daily and Ativan 1 mg at night.  Follow-up in 3 months, sooner if any worsening symptoms. -     TSH  Other orders -     sertraline (ZOLOFT) 50 MG tablet; Take 1 tablet (50 mg total) by mouth daily. -     LORazepam (ATIVAN) 1 MG tablet; Take 1 tablet (1 mg total) by mouth at bedtime.  . Reviewed expectations re: course of current medical issues. . Discussed self-management of symptoms. . Outlined signs and symptoms indicating need for more acute intervention. . Patient verbalized understanding and all questions were answered. . See orders for this visit as documented in the electronic medical record. . Patient received an After-Visit Summary.  CMA or LPN served as scribe during this visit. History, Physical, and Plan performed by medical provider. The above documentation has been reviewed and is accurate and complete.   Inda Coke, PA-C

## 2019-03-10 NOTE — Patient Instructions (Signed)
It was great to see you!  Sertraline and ativan have been sent in.  I will refill thyroid meds after labs have returned. Will be in touch with thyroid and vit D labs as soon as they get back.  Take care,  Inda Coke PA-C

## 2019-03-11 LAB — VITAMIN D 25 HYDROXY (VIT D DEFICIENCY, FRACTURES): VITD: 23.75 ng/mL — ABNORMAL LOW (ref 30.00–100.00)

## 2019-03-11 LAB — TSH: TSH: 7.02 u[IU]/mL — ABNORMAL HIGH (ref 0.35–4.50)

## 2019-03-12 ENCOUNTER — Other Ambulatory Visit: Payer: Self-pay

## 2019-03-12 MED ORDER — LEVOTHYROXINE SODIUM 88 MCG PO TABS: 88.0000 ug | ORAL_TABLET | Freq: Every day | ORAL | 3 refills | Status: DC

## 2019-03-18 ENCOUNTER — Other Ambulatory Visit: Payer: Self-pay | Admitting: Physician Assistant

## 2019-04-13 ENCOUNTER — Other Ambulatory Visit: Payer: Self-pay | Admitting: Family Medicine

## 2019-05-07 ENCOUNTER — Other Ambulatory Visit: Payer: Self-pay

## 2019-05-07 DIAGNOSIS — Z20822 Contact with and (suspected) exposure to covid-19: Secondary | ICD-10-CM

## 2019-05-09 LAB — NOVEL CORONAVIRUS, NAA: SARS-CoV-2, NAA: NOT DETECTED

## 2019-05-13 ENCOUNTER — Other Ambulatory Visit: Payer: Self-pay | Admitting: Physician Assistant

## 2019-05-13 ENCOUNTER — Telehealth: Payer: Self-pay | Admitting: Physician Assistant

## 2019-05-13 NOTE — Telephone Encounter (Signed)
See note  Copied from [place] [id number]. Topic: General - Other >> May 13, 2019  9:20 AM Celene Kras wrote: Reason for CRM: Pt called stating she is needing a note for her job stating she was tested on 05/07/2019 for covid. Please advise.

## 2019-05-13 NOTE — Telephone Encounter (Signed)
Spoke to pt asked her when she returned to work? Pt said today. Told her okay I will have letter ready by lunch time to pickup. Pt verbalized understanding and said she will pick it up later today.

## 2019-06-04 ENCOUNTER — Encounter: Payer: Self-pay | Admitting: Physician Assistant

## 2019-06-04 ENCOUNTER — Ambulatory Visit (INDEPENDENT_AMBULATORY_CARE_PROVIDER_SITE_OTHER): Payer: 59 | Admitting: Physician Assistant

## 2019-06-04 DIAGNOSIS — R05 Cough: Secondary | ICD-10-CM

## 2019-06-04 DIAGNOSIS — R059 Cough, unspecified: Secondary | ICD-10-CM

## 2019-06-04 DIAGNOSIS — Z7189 Other specified counseling: Secondary | ICD-10-CM

## 2019-06-04 MED ORDER — BENZONATATE 100 MG PO CAPS: 100.0000 mg | ORAL_CAPSULE | Freq: Three times a day (TID) | ORAL | 1 refills | Status: DC | PRN

## 2019-06-04 MED ORDER — PROMETHAZINE-DM 6.25-15 MG/5ML PO SYRP: 5.0000 mL | ORAL_SOLUTION | Freq: Every evening | ORAL | 0 refills | Status: DC | PRN

## 2019-06-04 MED ORDER — AMOXICILLIN-POT CLAVULANATE 875-125 MG PO TABS: 1.0000 | ORAL_TABLET | Freq: Two times a day (BID) | ORAL | 0 refills | Status: DC

## 2019-06-04 NOTE — Progress Notes (Signed)
Virtual Visit via Video   I connected with [employer] on 06/04/19 at  4:00 PM EST by a video enabled telemedicine application and verified that I am speaking with the correct person using two identifiers. Location patient: Home Location provider: [facility], Office Persons participating in the virtual visit: Sherrol Vicars, Inda Coke PA-C, Anselmo Pickler, LPN   I discussed the limitations of evaluation and management by telemedicine and the availability of in person appointments. The patient expressed understanding and agreed to proceed.  I acted as a Education administrator for [employer], PA-C [employer], LPN  Subjective:   HPI:   Patient is requesting evaluation for possible COVID-19.  Symptom onset: Saturday night 05/31/2019  Travel/contacts: No travel, husband was exposed at work last week but test (about 1 week after exposure) was negative.  Patient endorses the following symptoms: Fever (Sunday 100.2 and Monday 100), sinus headache, sinus congestion, rhinorrhea, itchy watery eyes, sore throat, difficulty swallowing, dry cough (non-productive), chest tightness and myalgia  Patient denies the following symptoms: red eyes, wheezing and shortness of breath  Treatments tried: Pt has been taking Tylenol & Ibuprofen for fever. Had Tessalon capsule left over but has run out.   Patient risk factors: Current ONGEX-52 risk of complications score: 0 Smoking status: Aveya Newbrough  reports that she has never smoked. She has never used smokeless tobacco. If female, currently pregnant? []   Yes [x]   No  ROS: See pertinent positives and negatives per HPI.  Patient Active Problem List   Diagnosis Date Noted  . Vitamin D deficiency 08/07/2017  . Constipation 08/07/2017  . Anxiety 08/07/2017  . Situational depression 08/07/2017  . Herpes simplex labialis 08/07/2017  . Hypothyroidism 08/07/2017    Social History   Tobacco Use  . Smoking status: Never Smoker  . Smokeless  tobacco: Never Used  Substance Use Topics  . Alcohol use: Yes    Current Outpatient Medications:  .  levothyroxine (SYNTHROID) 88 MCG tablet, Take 1 tablet (88 mcg total) by mouth daily., Disp: 90 tablet, Rfl: 3 .  LORazepam (ATIVAN) 1 MG tablet, Take 1 tablet (1 mg total) by mouth at bedtime., Disp: 30 tablet, Rfl: 2 .  sertraline (ZOLOFT) 50 MG tablet, Take 1 tablet (50 mg total) by mouth daily., Disp: 90 tablet, Rfl: 1 .  valACYclovir (VALTREX) 1000 MG tablet, Take two tablets ( total 2000 mg) by mouth q12h x 1 day; Start: ASAP after symptom onset, Disp: 6 tablet, Rfl: 2 .  Vitamin D, Ergocalciferol, 2000 units CAPS, Take 3 capsules by mouth daily. , Disp: , Rfl:  .  amoxicillin-clavulanate (AUGMENTIN) 875-125 MG tablet, Take 1 tablet by mouth 2 (two) times daily., Disp: 20 tablet, Rfl: 0 .  benzonatate (TESSALON PERLES) 100 MG capsule, Take 1 capsule (100 mg total) by mouth 3 (three) times daily as needed for cough., Disp: 30 capsule, Rfl: 1 .  fluticasone (FLONASE) 50 MCG/ACT nasal spray, SPRAY 2 SPRAYS INTO EACH NOSTRIL EVERY DAY (Patient not taking: Reported on 06/04/2019), Disp: 48 mL, Rfl: 1 .  promethazine-dextromethorphan (PROMETHAZINE-DM) 6.25-15 MG/5ML syrup, Take 5 mLs by mouth at bedtime as needed for cough., Disp: 120 mL, Rfl: 0  Allergies  Allergen Reactions  . Erythromycin Palpitations  . Keflex [Cephalexin] Hives    Objective:   VITALS: Per patient if applicable, see vitals. GENERAL: Alert, appears well and in no acute distress. HEENT: Atraumatic, conjunctiva clear, no obvious abnormalities on inspection of external nose and ears. NECK: Normal movements of the head and  neck. CARDIOPULMONARY: No increased WOB. Speaking in clear sentences. I:E ratio WNL.  MS: Moves all visible extremities without noticeable abnormality. PSYCH: Pleasant and cooperative, well-groomed. Speech normal rate and rhythm. Affect is appropriate. Insight and judgement are appropriate. Attention is  focused, linear, and appropriate.  NEURO: CN grossly intact. Oriented as arrived to appointment on time with no prompting. Moves both UE equally.  SKIN: No obvious lesions, wounds, erythema, or cyanosis noted on face or hands.  Assessment and Plan:   Sobia was seen today for covid symptoms.  Diagnoses and all orders for this visit:  Cough -     Novel Coronavirus, NAA ([facility])  Advice given about COVID-19 virus infection -     Novel Coronavirus, NAA ([facility])  Other orders -     amoxicillin-clavulanate (AUGMENTIN) 875-125 MG tablet; Take 1 tablet by mouth 2 (two) times daily. -     promethazine-dextromethorphan (PROMETHAZINE-DM) 6.25-15 MG/5ML syrup; Take 5 mLs by mouth at bedtime as needed for cough. -     benzonatate (TESSALON PERLES) 100 MG capsule; Take 1 capsule (100 mg total) by mouth 3 (three) times daily as needed for cough.   Patient has a respiratory illness without signs of acute distress or respiratory compromise at this time.  We are going to send patient for COVID-19 testing. As a precaution, they have been advised to remain home until COVID-19 results and then possible further quarantine after that based on results and symptoms.   She was given a safety rx for Augmentin should her cough worsen, she develop fever, or other worsening symptoms. I have also prescribed her promethazine and tessalon perles to use as needed for cough.  Advised if they experience a "second sickening" or worsening symptoms as the illness progresses, they are to call the office for further instructions or seek emergent evaluation for any severe symptoms.   . Reviewed expectations re: course of current medical issues. . Discussed self-management of symptoms. . Outlined signs and symptoms indicating need for more acute intervention. . Patient verbalized understanding and all questions were answered. Marland Kitchen Health Maintenance issues including appropriate healthy diet, exercise, and smoking avoidance  were discussed with patient. . See orders for this visit as documented in the electronic medical record.  I discussed the assessment and treatment plan with the patient. The patient was provided an opportunity to ask questions and all were answered. The patient agreed with the plan and demonstrated an understanding of the instructions.   The patient was advised to call back or seek an in-person evaluation if the symptoms worsen or if the condition fails to improve as anticipated.   CMA or LPN served as scribe during this visit. History, Physical, and Plan performed by medical provider. The above documentation has been reviewed and is accurate and complete.  [place], [place] 06/04/2019

## 2019-06-05 ENCOUNTER — Ambulatory Visit: Payer: 59 | Attending: Internal Medicine

## 2019-06-05 DIAGNOSIS — Z20822 Contact with and (suspected) exposure to covid-19: Secondary | ICD-10-CM

## 2019-06-07 LAB — NOVEL CORONAVIRUS, NAA: SARS-CoV-2, NAA: DETECTED — AB

## 2019-06-09 ENCOUNTER — Telehealth: Payer: Self-pay | Admitting: Physician Assistant

## 2019-06-09 NOTE — Telephone Encounter (Signed)
See result note.  

## 2019-06-09 NOTE — Telephone Encounter (Signed)
Patient called in letting us know that she is now covid positive but also wanted to know if someone could call her, she had some questions about this,

## 2019-06-09 NOTE — Telephone Encounter (Signed)
Left message on voicemail to call office.  

## 2019-06-11 ENCOUNTER — Telehealth: Payer: Self-pay | Admitting: Physician Assistant

## 2019-06-11 NOTE — Telephone Encounter (Signed)
Pt requesting refill on Ativan 1 mg. Last OV 06/04/2019.

## 2019-06-11 NOTE — Telephone Encounter (Signed)
Pt called needing an Rx refill  Lorazepam 1 mg tablet   [facility]

## 2019-06-12 ENCOUNTER — Ambulatory Visit: Payer: 59 | Admitting: Physician Assistant

## 2019-06-12 ENCOUNTER — Other Ambulatory Visit: Payer: Self-pay | Admitting: Physician Assistant

## 2019-06-12 MED ORDER — LORAZEPAM 1 MG PO TABS: 1.0000 mg | ORAL_TABLET | Freq: Every day | ORAL | 2 refills | Status: DC

## 2019-06-12 NOTE — Telephone Encounter (Signed)
Sent!

## 2019-06-12 NOTE — Telephone Encounter (Signed)
Pt notified Rx for Ativan was sent to pharmacy.

## 2019-06-20 ENCOUNTER — Ambulatory Visit: Payer: 59 | Admitting: Physician Assistant

## 2019-06-27 ENCOUNTER — Telehealth: Payer: Self-pay | Admitting: Physician Assistant

## 2019-06-27 NOTE — Telephone Encounter (Signed)
FYI, pt sent to ED. 

## 2019-06-27 NOTE — Telephone Encounter (Signed)
Pt called stating that she has been having lingering symptoms from COVID. She is experiencing shortness of breath and heaviness on her chest. Transferred pt to nurse to get triaged.

## 2019-06-27 NOTE — Telephone Encounter (Signed)
Chief Complaint CHEST PAIN (>=21 years) - pain, pressure, heaviness or tightness Reason for Call Symptomatic / Request for Health Information Initial Comment Caller states she had COVID-19 and this is her first week back to work. She is having heaviness on her chest. GOTO Facility Not Listed [facility] ER Translation No Nurse Assessment Nurse: Annye English, RN, Angelique Blonder Date/Time Lamount Cohen Time): 06/27/2019 11:41:01 AM Confirm and document reason for call. If symptomatic, describe symptoms. ---Caller states she had COVID-19 and this is her first week back to work. She is having heaviness on her chest. States she feels like she is having a hard time catching her breath. Has the patient had close contact with a person known or suspected to have the novel coronavirus illness OR traveled / lives in area with major community spread (including international travel) in the last 14 days from the onset of symptoms? * If Asymptomatic, screen for exposure and travel within the last 14 days. ---Yes Does the patient have any new or worsening symptoms? ---Yes Will a triage be completed? ---Yes Related visit to physician within the last 2 weeks? ---No Does the PT have any chronic conditions? (i.e. diabetes, asthma, this includes High risk factors for pregnancy, etc.) ---Yes List chronic conditions. ---Hyperthyroidism, MVP, Heart murmur Is the patient pregnant or possibly pregnant? (Ask all females between the ages of 66-55) ---No Is this a behavioral health or substance abuse call? ---No PLEASE NOTE: All timestamps contained within this report are represented as [place] Standard Time. CONFIDENTIALTY NOTICE: This fax transmission is intended only for the addressee. It contains information that is legally privileged, confidential or otherwise protected from use or disclosure. If you are not the intended recipient, you are strictly prohibited from reviewing, disclosing, copying using or disseminating  any of this information or taking any action in reliance on or regarding this information. If you have received this fax in error, please notify us immediately by telephone so that we can arrange for its return to [place]. Phone: [phone], Toll-Free: [phone], Fax: [phone] Page: 2 of 2 Call Id: [id number] Guidelines Guideline Title Affirmed Question Affirmed Notes Nurse Date/Time Lamount Cohen Time) Coronavirus (COVID-19) - Diagnosed or Suspected SEVERE or constant chest pain or pressure (Exception: mild central chest pain, present only when coughing) Carmon, RN, Angelique Blonder 06/27/2019 11:43:07 AM Disp. Time Lamount Cohen Time) Disposition Final User 06/27/2019 11:38:20 AM Send to Urgent Queue Josephina Shih 06/27/2019 11:44:57 AM Go to ED Now Yes Carmon, RN, Angelique Blonder

## 2019-08-04 ENCOUNTER — Ambulatory Visit (INDEPENDENT_AMBULATORY_CARE_PROVIDER_SITE_OTHER): Payer: 59 | Admitting: Physician Assistant

## 2019-08-04 ENCOUNTER — Ambulatory Visit: Payer: 59 | Admitting: Physician Assistant

## 2019-08-04 ENCOUNTER — Encounter: Payer: Self-pay | Admitting: Physician Assistant

## 2019-08-04 ENCOUNTER — Other Ambulatory Visit: Payer: Self-pay

## 2019-08-04 VITALS — BP 120/68 | HR 87 | Temp 98.2°F | Ht 60.0 in | Wt 110.0 lb

## 2019-08-04 DIAGNOSIS — E039 Hypothyroidism, unspecified: Secondary | ICD-10-CM

## 2019-08-04 DIAGNOSIS — R300 Dysuria: Secondary | ICD-10-CM

## 2019-08-04 LAB — POCT URINALYSIS DIPSTICK
Bilirubin, UA: NEGATIVE
Blood, UA: POSITIVE
Glucose, UA: NEGATIVE
Ketones, UA: NEGATIVE
Leukocytes, UA: NEGATIVE
Nitrite, UA: NEGATIVE
Protein, UA: NEGATIVE
Spec Grav, UA: 1.025 (ref 1.010–1.025)
Urobilinogen, UA: 0.2 E.U./dL
pH, UA: 5 (ref 5.0–8.0)

## 2019-08-04 LAB — TSH: TSH: 0.02 u[IU]/mL — ABNORMAL LOW (ref 0.35–4.50)

## 2019-08-04 MED ORDER — NITROFURANTOIN MONOHYD MACRO 100 MG PO CAPS: 100.0000 mg | ORAL_CAPSULE | Freq: Two times a day (BID) | ORAL | 0 refills | Status: DC

## 2019-08-04 NOTE — Progress Notes (Signed)
Jessica Juarez is a 49 y.o. female here for a new problem.  I acted as a Neurosurgeon for [employer], PA-C Corky Mull, LPN  History of Present Illness:   Chief Complaint  Patient presents with  . Urinary Frequency  . Dysuria    HPI   Urinary symptoms Pt c/o urinary frequency and pain with urination since Thursday. Also having some low back pain. Has had recent increase in sexual activity with her husband. She took Azo x 3 days started Friday night and also took Amoxicillin 875 mg BID all weekend. Denies: fevers, chills, vaginal discharge, n/v  Patient's last menstrual period was 07/20/2019 (lmp unknown).  S/p tubal ligation.  Hypothyroidism Currently taking 88 mcg levothyroxine daily. Was increased from 50 mcg in October. Denies any concerns for hypothyroidism -- denies unusual weight changes, changes in hair/skin/nails, constipation.   Past Medical History:  Diagnosis Date  . Anxiety   . Heart murmur      Social History   Socioeconomic History  . Marital status: Married    Spouse name: Not on file  . Number of children: Not on file  . Years of education: Not on file  . Highest education level: Not on file  Occupational History  . Not on file  Tobacco Use  . Smoking status: Never Smoker  . Smokeless tobacco: Never Used  Substance and Sexual Activity  . Alcohol use: Yes  . Drug use: No  . Sexual activity: Yes  Other Topics Concern  . 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 Difficulty of Paying Living Expenses: Not on file  Food Insecurity:   . Worried About Programme researcher, broadcasting/film/video in the Last Year: Not on file  . Ran Out of Food in the Last Year: Not on file  Transportation Needs:   . Lack of Transportation (Medical): Not on file  . Lack of Transportation (Non-Medical): Not on file  Physical Activity:   . Days of Exercise per Week: Not on file  . Minutes  of Exercise per Session: Not on file  Stress:   . Feeling of Stress : Not on file  Social Connections:   . Frequency of Communication with Friends and Family: Not on file  . Frequency of Social Gatherings with Friends and Family: Not on file  . Attends Religious Services: Not on file  . Active Member of Clubs or Organizations: Not on file  . Attends Banker Meetings: Not on file  . Marital Status: Not on file  Intimate Partner Violence:   . Fear of Current or Ex-Partner: Not on file  . Emotionally Abused: Not on file  . Physically Abused: Not on file  . Sexually Abused: Not on file    Past Surgical History:  Procedure Laterality Date  . CESAREAN SECTION    . EYE SURGERY    . TUBAL LIGATION    . WISDOM TOOTH EXTRACTION      Family History  Problem Relation Age of Onset  . Diabetes Mother   . Depression Mother   . Colon cancer Father        49  . Liver cancer Father   . Thyroid disease Brother   . Migraines Son   . Diabetes Maternal Grandmother   . Colon cancer Paternal Grandfather     Allergies  Allergen Reactions  . Erythromycin Palpitations  .  Keflex [Cephalexin] Hives    Current Medications:   Current Outpatient Medications:  .  amoxicillin-clavulanate (AUGMENTIN) 875-125 MG tablet, Take 1 tablet by mouth 2 (two) times daily., Disp: 20 tablet, Rfl: 0 .  fluticasone (FLONASE) 50 MCG/ACT nasal spray, SPRAY 2 SPRAYS INTO EACH NOSTRIL EVERY DAY, Disp: 48 mL, Rfl: 1 .  levothyroxine (SYNTHROID) 88 MCG tablet, Take 1 tablet (88 mcg total) by mouth daily., Disp: 90 tablet, Rfl: 3 .  LORazepam (ATIVAN) 1 MG tablet, Take 1 tablet (1 mg total) by mouth at bedtime., Disp: 30 tablet, Rfl: 2 .  sertraline (ZOLOFT) 50 MG tablet, Take 1 tablet (50 mg total) by mouth daily., Disp: 90 tablet, Rfl: 1 .  valACYclovir (VALTREX) 1000 MG tablet, Take two tablets ( total 2000 mg) by mouth q12h x 1 day; Start: ASAP after symptom onset, Disp: 6 tablet, Rfl: 2 .  Vitamin D,  Ergocalciferol, 2000 units CAPS, Take 3 capsules by mouth daily. , Disp: , Rfl:  .  benzonatate (TESSALON PERLES) 100 MG capsule, Take 1 capsule (100 mg total) by mouth 3 (three) times daily as needed for cough. (Patient not taking: Reported on 08/04/2019), Disp: 30 capsule, Rfl: 1 .  nitrofurantoin, macrocrystal-monohydrate, (MACROBID) 100 MG capsule, Take 1 capsule (100 mg total) by mouth 2 (two) times daily., Disp: 10 capsule, Rfl: 0   Review of Systems:   ROS  Negative unless otherwise specified per HPI.  Vitals:   Vitals:   08/04/19 1304  BP: 120/68  Pulse: 87  Temp: 98.2 F (36.8 C)  TempSrc: Temporal  SpO2: 98%  Weight: 110 lb (49.9 kg)  Height: 5' (1.524 m)     Body mass index is 21.48 kg/m.  Physical Exam:   Physical Exam Vitals and nursing note reviewed.  Constitutional:      General: She is not in acute distress.    Appearance: She is well-developed. She is not ill-appearing or toxic-appearing.  Cardiovascular:     Rate and Rhythm: Normal rate and regular rhythm.     Pulses: Normal pulses.     Heart sounds: Normal heart sounds, S1 normal and S2 normal.     Comments: No LE edema Pulmonary:     Effort: Pulmonary effort is normal.     Breath sounds: Normal breath sounds.  Abdominal:     General: Abdomen is flat. Bowel sounds are normal.     Palpations: Abdomen is soft.     Tenderness: There is abdominal tenderness in the suprapubic area. There is no right CVA tenderness, left CVA tenderness, guarding or rebound.  Skin:    General: Skin is warm and dry.  Neurological:     Mental Status: She is alert.     GCS: GCS eye subscore is 4. GCS verbal subscore is 5. GCS motor subscore is 6.  Psychiatric:        Speech: Speech normal.        Behavior: Behavior normal. Behavior is cooperative.     Results for orders placed or performed in visit on 08/04/19  POCT urinalysis dipstick  Result Value Ref Range   Color, UA orange    Clarity, UA clear    Glucose, UA  Negative Negative   Bilirubin, UA Negative    Ketones, UA Negative    Spec Grav, UA 1.025 1.010 - 1.025   Blood, UA positive    pH, UA 5.0 5.0 - 8.0   Protein, UA Negative Negative   Urobilinogen, UA 0.2 0.2 or 1.0 E.U./dL  Nitrite, UA Negative    Leukocytes, UA Negative Negative   Appearance     Odor      Assessment and Plan:   Jessica Juarez was seen today for urinary frequency and dysuria.  Diagnoses and all orders for this visit:  Dysuria Improving, but will have patient stop amoxicillin and start oral macrobid given that she is still having ongoing symptoms. Urine culture pending. Worsening precautions advised. -     POCT urinalysis dipstick -     Urine Culture  Hypothyroidism, unspecified type Will update her TSH today and reassess thyroid control. Follow-up based upon lab results. -     TSH  Other orders -     nitrofurantoin, macrocrystal-monohydrate, (MACROBID) 100 MG capsule; Take 1 capsule (100 mg total) by mouth 2 (two) times daily.  . Reviewed expectations re: course of current medical issues. . Discussed self-management of symptoms. . Outlined signs and symptoms indicating need for more acute intervention. . Patient verbalized understanding and all questions were answered. . See orders for this visit as documented in the electronic medical record. . Patient received an After-Visit Summary.  CMA or LPN served as scribe during this visit. History, Physical, and Plan performed by medical provider. The above documentation has been reviewed and is accurate and complete.  Inda Coke, PA-C

## 2019-08-04 NOTE — Patient Instructions (Signed)
It was great to see you!  Start macrobid antibiotic. I will be in touch with urine culture and thyroid lab results.  General instructions  Make sure you: ? Pee until your bladder is empty. ? Do not hold pee for a long time. ? Empty your bladder after sex. ? Wipe from front to back after pooping if you are a female. Use each tissue one time when you wipe.  Drink enough fluid to keep your pee pale yellow.  Keep all follow-up visits as told by your doctor. This is important. Contact a doctor if:  You do not get better after 1-2 days.  Your symptoms go away and then come back. Get help right away if:  You have very bad back pain.  You have very bad pain in your lower belly.  You have a fever.  You are sick to your stomach (nauseous).  You are throwing up.   Take care,  Jarold Motto PA-C

## 2019-08-04 NOTE — Progress Notes (Deleted)
Harmony Sandell is a 49 y.o. female here for a new problem.  SCRIBE STATEMENT  History of Present Illness:   No chief complaint on file.   HPI  Past Medical History:  Diagnosis Date  . Anxiety   . Heart murmur      Social History   Socioeconomic History  . Marital status: Married    Spouse name: Not on file  . Number of children: Not on file  . Years of education: Not on file  . Highest education level: Not on file  Occupational History  . Not on file  Tobacco Use  . Smoking status: Never Smoker  . Smokeless tobacco: Never Used  Substance and Sexual Activity  . Alcohol use: Yes  . Drug use: No  . Sexual activity: Yes  Other Topics Concern  . 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 Difficulty of Paying Living Expenses: Not on file  Food Insecurity:   . Worried About Programme researcher, broadcasting/film/video in the Last Year: Not on file  . Ran Out of Food in the Last Year: Not on file  Transportation Needs:   . Lack of Transportation (Medical): Not on file  . Lack of Transportation (Non-Medical): Not on file  Physical Activity:   . Days of Exercise per Week: Not on file  . Minutes of Exercise per Session: Not on file  Stress:   . Feeling of Stress : Not on file  Social Connections:   . Frequency of Communication with Friends and Family: Not on file  . Frequency of Social Gatherings with Friends and Family: Not on file  . Attends Religious Services: Not on file  . Active Member of Clubs or Organizations: Not on file  . Attends Banker Meetings: Not on file  . Marital Status: Not on file  Intimate Partner Violence:   . Fear of Current or Ex-Partner: Not on file  . Emotionally Abused: Not on file  . Physically Abused: Not on file  . Sexually Abused: Not on file    Past Surgical History:  Procedure Laterality Date  . CESAREAN SECTION    . EYE SURGERY    .  TUBAL LIGATION    . WISDOM TOOTH EXTRACTION      Family History  Problem Relation Age of Onset  . Diabetes Mother   . Depression Mother   . Colon cancer Father        64  . Liver cancer Father   . Thyroid disease Brother   . Migraines Son   . Diabetes Maternal Grandmother   . Colon cancer Paternal Grandfather     Allergies  Allergen Reactions  . Erythromycin Palpitations  . Keflex [Cephalexin] Hives    Current Medications:   Current Outpatient Medications:  .  amoxicillin-clavulanate (AUGMENTIN) 875-125 MG tablet, Take 1 tablet by mouth 2 (two) times daily., Disp: 20 tablet, Rfl: 0 .  benzonatate (TESSALON PERLES) 100 MG capsule, Take 1 capsule (100 mg total) by mouth 3 (three) times daily as needed for cough., Disp: 30 capsule, Rfl: 1 .  fluticasone (FLONASE) 50 MCG/ACT nasal spray, SPRAY 2 SPRAYS INTO EACH NOSTRIL EVERY DAY (Patient not taking: Reported on 06/04/2019), Disp: 48 mL, Rfl: 1 .  levothyroxine (SYNTHROID) 88 MCG tablet, Take 1 tablet (88 mcg total) by mouth daily., Disp: 90 tablet, Rfl: 3 .  LORazepam (ATIVAN) 1 MG tablet, Take 1 tablet (1 mg total) by mouth at bedtime., Disp: 30 tablet, Rfl: 2 .  promethazine-dextromethorphan (PROMETHAZINE-DM) 6.25-15 MG/5ML syrup, Take 5 mLs by mouth at bedtime as needed for cough., Disp: 120 mL, Rfl: 0 .  sertraline (ZOLOFT) 50 MG tablet, Take 1 tablet (50 mg total) by mouth daily., Disp: 90 tablet, Rfl: 1 .  valACYclovir (VALTREX) 1000 MG tablet, Take two tablets ( total 2000 mg) by mouth q12h x 1 day; Start: ASAP after symptom onset, Disp: 6 tablet, Rfl: 2 .  Vitamin D, Ergocalciferol, 2000 units CAPS, Take 3 capsules by mouth daily. , Disp: , Rfl:    Review of Systems:   ROS  Vitals:   There were no vitals filed for this visit.   There is no height or weight on file to calculate BMI.  Physical Exam:   Physical Exam  Results for orders placed or performed in visit on 06/05/19  Novel Coronavirus, NAA ([facility])    Specimen: Nasopharyngeal(NP) swabs in vial transport medium   NASOPHARYNGE  TESTING  Result Value Ref Range   SARS-CoV-2, NAA Detected (A) Not Detected    Assessment and Plan:   There are no diagnoses linked to this encounter.  . Reviewed expectations re: course of current medical issues. . Discussed self-management of symptoms. . Outlined signs and symptoms indicating need for more acute intervention. . Patient verbalized understanding and all questions were answered. . See orders for this visit as documented in the electronic medical record. . Patient received an After-Visit Summary.  ***  Inda Coke, PA-C

## 2019-08-05 LAB — URINE CULTURE
MICRO NUMBER:: 10226069
Result:: NO GROWTH
SPECIMEN QUALITY:: ADEQUATE

## 2019-08-06 ENCOUNTER — Other Ambulatory Visit: Payer: Self-pay | Admitting: Physician Assistant

## 2019-08-06 MED ORDER — LEVOTHYROXINE SODIUM 75 MCG PO TABS: 75.0000 ug | ORAL_TABLET | Freq: Every day | ORAL | 1 refills | Status: DC

## 2019-09-03 ENCOUNTER — Other Ambulatory Visit: Payer: Self-pay | Admitting: Physician Assistant

## 2019-09-04 ENCOUNTER — Other Ambulatory Visit: Payer: Self-pay | Admitting: Physician Assistant

## 2019-09-05 ENCOUNTER — Telehealth: Payer: Self-pay | Admitting: Physician Assistant

## 2019-09-05 MED ORDER — LORAZEPAM 1 MG PO TABS: 1.0000 mg | ORAL_TABLET | Freq: Every day | ORAL | 2 refills | Status: DC

## 2019-09-05 NOTE — Telephone Encounter (Signed)
LAST APPOINTMENT DATE: 08/04/2019  NEXT APPOINTMENT DATE:@Visit  date not found   LAST REFILL: 06/12/2019  QTY: 30 Tablet

## 2019-09-05 NOTE — Telephone Encounter (Signed)
MEDICATION: Lorazepam 1 MG  PHARMACY: [facility] [address]  Comments: Please advise if pt needs appt  **Let patient know to contact pharmacy at the end of the day to make sure medication is ready. **  ** Please notify patient to allow 48-72 hours to process**  **Encourage patient to contact the pharmacy for refills or they can request refills through [facility]**

## 2019-09-08 NOTE — Telephone Encounter (Signed)
Left message on voicemail Rx was sent to pharmacy on Friday. Check with the pharmacy.

## 2019-09-08 NOTE — Telephone Encounter (Signed)
Patient is calling in asking for an update, states she has not heard from anyone, states she only has enough until Wednesday.

## 2019-10-01 ENCOUNTER — Other Ambulatory Visit: Payer: Self-pay | Admitting: Physician Assistant

## 2019-10-24 ENCOUNTER — Other Ambulatory Visit: Payer: Self-pay

## 2019-10-28 ENCOUNTER — Other Ambulatory Visit: Payer: Self-pay | Admitting: Physician Assistant

## 2019-10-28 ENCOUNTER — Other Ambulatory Visit: Payer: Self-pay | Admitting: *Deleted

## 2019-10-28 ENCOUNTER — Other Ambulatory Visit: Payer: Self-pay | Admitting: Family Medicine

## 2019-10-28 MED ORDER — LEVOTHYROXINE SODIUM 75 MCG PO TABS: 75.0000 ug | ORAL_TABLET | Freq: Every day | ORAL | 0 refills | Status: DC

## 2019-10-28 NOTE — Telephone Encounter (Signed)
Please review

## 2019-10-29 ENCOUNTER — Other Ambulatory Visit: Payer: Self-pay | Admitting: Physician Assistant

## 2019-10-29 ENCOUNTER — Other Ambulatory Visit: Payer: Self-pay | Admitting: *Deleted

## 2019-10-29 DIAGNOSIS — E039 Hypothyroidism, unspecified: Secondary | ICD-10-CM

## 2019-10-30 ENCOUNTER — Other Ambulatory Visit: Payer: 59

## 2019-10-31 ENCOUNTER — Other Ambulatory Visit: Payer: 59

## 2019-12-02 ENCOUNTER — Other Ambulatory Visit (INDEPENDENT_AMBULATORY_CARE_PROVIDER_SITE_OTHER): Payer: 59

## 2019-12-02 ENCOUNTER — Telehealth: Payer: Self-pay | Admitting: Physician Assistant

## 2019-12-02 ENCOUNTER — Other Ambulatory Visit: Payer: Self-pay

## 2019-12-02 DIAGNOSIS — E039 Hypothyroidism, unspecified: Secondary | ICD-10-CM | POA: Diagnosis not present

## 2019-12-03 ENCOUNTER — Telehealth (INDEPENDENT_AMBULATORY_CARE_PROVIDER_SITE_OTHER): Payer: 59 | Admitting: Physician Assistant

## 2019-12-03 ENCOUNTER — Encounter: Payer: Self-pay | Admitting: Physician Assistant

## 2019-12-03 DIAGNOSIS — F5102 Adjustment insomnia: Secondary | ICD-10-CM | POA: Diagnosis not present

## 2019-12-03 DIAGNOSIS — E039 Hypothyroidism, unspecified: Secondary | ICD-10-CM | POA: Diagnosis not present

## 2019-12-03 DIAGNOSIS — F419 Anxiety disorder, unspecified: Secondary | ICD-10-CM

## 2019-12-03 LAB — TSH: TSH: 0.33 u[IU]/mL — ABNORMAL LOW (ref 0.35–4.50)

## 2019-12-03 MED ORDER — SERTRALINE HCL 50 MG PO TABS: 50.0000 mg | ORAL_TABLET | Freq: Every day | ORAL | 1 refills | Status: DC

## 2019-12-03 MED ORDER — LEVOTHYROXINE SODIUM 75 MCG PO TABS: 75.0000 ug | ORAL_TABLET | Freq: Every day | ORAL | 1 refills | Status: DC

## 2019-12-03 MED ORDER — LORAZEPAM 1 MG PO TABS: 1.0000 mg | ORAL_TABLET | Freq: Every day | ORAL | 1 refills | Status: DC

## 2019-12-03 NOTE — Telephone Encounter (Signed)
error 

## 2019-12-03 NOTE — Progress Notes (Signed)
Virtual Visit via Video   I connected with [employer] on 12/03/19 at 12:00 PM EDT by a video enabled telemedicine application and verified that I am speaking with the correct person using two identifiers. Location patient: Home Location provider: [facility], Office Persons participating in the virtual visit: Kelsie Kramp Ragen, Jarold Motto PA-C, Corky Mull, LPN   I discussed the limitations of evaluation and management by telemedicine and the availability of in person appointments. The patient expressed understanding and agreed to proceed.  I acted as a Neurosurgeon for [employer], PA-C [employer], LPN   Subjective:   HPI:   Anxiety/Depression Pt following up, currently taking Zoloft 50 mg daily and Lorazepam 1 mg at bedtime. Tolerating medication well. Denies SI/HI.  She feels like her mood is doing really well in fact she is gaining weight because her appetite has been better.  GAD 7 : Generalized Anxiety Score 12/03/2019 09/16/2018 06/24/2018 12/24/2017  Nervous, Anxious, on Edge 0 0 0 1  Control/stop worrying 0 0 0 1  Worry too much - different things 0 0 0 1  Trouble relaxing 0 0 0 1  Restless 0 0 0 1  Easily annoyed or irritable 0 0 0 1  Afraid - awful might happen 0 0 0 1  Total GAD 7 Score 0 0 0 7  Anxiety Difficulty Not difficult at all Not difficult at all Not difficult at all Somewhat difficult    Depression screen [facility] 2/9 12/03/2019 09/16/2018 06/24/2018 09/19/2017 08/07/2017  Decreased Interest 0 1 0 0 1  Down, Depressed, Hopeless 0 1 0 0 1  PHQ - 2 Score 0 2 0 0 2  Altered sleeping 0 0 0 0 1  Tired, decreased energy 0 0 0 0 2  Change in appetite 0 0 0 0 2  Feeling bad or failure about yourself  0 0 0 0 1  Trouble concentrating 0 0 0 0 1  Moving slowly or fidgety/restless 0 0 0 0 1  Suicidal thoughts 0 0 0 0 0  PHQ-9 Score 0 2 0 0 10  Difficult doing work/chores Not difficult at all Not difficult at all Not difficult at all Not difficult at all Somewhat  difficult    Hypothyroidism Patient is currently on 75 mcg levothyroxine daily.  She takes this regularly on an empty stomach first thing in the morning.  She was able to come by and get labs and her TSH today is 0.33.  This is improved from 4 months ago where it was 0.02.  The only thing that she has noticed regarding her thyroid, is increase in weight, but she feels as though this is likely due to improved mood.  Denies any concerns with fatigue, skin or nail changes, or changes in bowels.  Wt Readings from Last 3 Encounters:  08/04/19 110 lb (49.9 kg)  03/10/19 106 lb (48.1 kg)  02/18/19 107 lb (48.5 kg)     ROS: See pertinent positives and negatives per HPI.  Patient Active Problem List   Diagnosis Date Noted  . Vitamin D deficiency 08/07/2017  . Constipation 08/07/2017  . Anxiety 08/07/2017  . Situational depression 08/07/2017  . Herpes simplex labialis 08/07/2017  . Hypothyroidism 08/07/2017    Social History   Tobacco Use  . Smoking status: Never Smoker  . Smokeless tobacco: Never Used  Substance Use Topics  . Alcohol use: Yes    Current Outpatient Medications:  .  levothyroxine (SYNTHROID) 75 MCG tablet, Take 1 tablet (75  mcg total) by mouth daily., Disp: 90 tablet, Rfl: 1 .  sertraline (ZOLOFT) 50 MG tablet, Take 1 tablet (50 mg total) by mouth daily., Disp: 90 tablet, Rfl: 1 .  valACYclovir (VALTREX) 1000 MG tablet, Take two tablets ( total 2000 mg) by mouth q12h x 1 day; Start: ASAP after symptom onset, Disp: 6 tablet, Rfl: 2 .  Vitamin D, Ergocalciferol, 2000 units CAPS, Take 3 capsules by mouth daily. , Disp: , Rfl:  .  LORazepam (ATIVAN) 1 MG tablet, Take 1 tablet (1 mg total) by mouth at bedtime., Disp: 90 tablet, Rfl: 1  Allergies  Allergen Reactions  . Erythromycin Palpitations  . Keflex [Cephalexin] Hives    Objective:   VITALS: Per patient if applicable, see vitals. GENERAL: Alert, appears well and in no acute distress. HEENT: Atraumatic,  conjunctiva clear, no obvious abnormalities on inspection of external nose and ears. NECK: Normal movements of the head and neck. CARDIOPULMONARY: No increased WOB. Speaking in clear sentences. I:E ratio WNL.  MS: Moves all visible extremities without noticeable abnormality. PSYCH: Pleasant and cooperative, well-groomed. Speech normal rate and rhythm. Affect is appropriate. Insight and judgement are appropriate. Attention is focused, linear, and appropriate.  NEURO: CN grossly intact. Oriented as arrived to appointment on time with no prompting. Moves both UE equally.  SKIN: No obvious lesions, wounds, erythema, or cyanosis noted on face or hands.  Assessment and Plan:   Mallorie was seen today for anxiety and depression.  Diagnoses and all orders for this visit:  Adjustment insomnia Database reviewed and no concerns noted.  She has been on this medication for several years, and she is not interested in changing therapies at this time.  It is appropriate to continue current prescription.  I have refilled her Ativan for 6 months, will have patient follow-up at that time. -     LORazepam (ATIVAN) 1 MG tablet; Take 1 tablet (1 mg total) by mouth at bedtime.  Anxiety Currently very well controlled with Zoloft 50 mg daily.  Continue current regimen and follow-up in 6 months, sooner if concerns.  Hypothyroidism, unspecified type Her thyroid is almost at goal.  After discussion of symptoms, we will continue current dose of levothyroxine 75 mcg daily.  Follow-up in 6 months, of course if the patient feels as though her thyroid is becoming less controlled, she was instructed to follow-up in the office sooner.  Other orders -     levothyroxine (SYNTHROID) 75 MCG tablet; Take 1 tablet (75 mcg total) by mouth daily. -     sertraline (ZOLOFT) 50 MG tablet; Take 1 tablet (50 mg total) by mouth daily.   . Reviewed expectations re: course of current medical issues. . Discussed self-management of  symptoms. . Outlined signs and symptoms indicating need for more acute intervention. . Patient verbalized understanding and all questions were answered. Marland Kitchen Health Maintenance issues including appropriate healthy diet, exercise, and smoking avoidance were discussed with patient. . See orders for this visit as documented in the electronic medical record.  I discussed the assessment and treatment plan with the patient. The patient was provided an opportunity to ask questions and all were answered. The patient agreed with the plan and demonstrated an understanding of the instructions.   The patient was advised to call back or seek an in-person evaluation if the symptoms worsen or if the condition fails to improve as anticipated.   CMA or LPN served as scribe during this visit. History, Physical, and Plan performed by medical provider.  The above documentation has been reviewed and is accurate and complete.  [place], [place] 12/03/2019

## 2019-12-17 ENCOUNTER — Telehealth: Payer: 59 | Admitting: Physician Assistant

## 2019-12-17 ENCOUNTER — Ambulatory Visit: Payer: 59 | Admitting: Physician Assistant

## 2019-12-17 NOTE — Progress Notes (Deleted)
Jessica Juarez is a 49 y.o. female here for a new problem.  I acted as a Neurosurgeon for [employer], PA-C [employer], LPN   History of Present Illness:   No chief complaint on file.   HPI  Past Medical History:  Diagnosis Date  . Anxiety   . Heart murmur      Social History   Tobacco Use  . Smoking status: Never Smoker  . Smokeless tobacco: Never Used  Vaping Use  . Vaping Use: Never used  Substance Use Topics  . Alcohol use: Yes  . Drug use: No    Past Surgical History:  Procedure Laterality Date  . CESAREAN SECTION    . EYE SURGERY    . TUBAL LIGATION    . WISDOM TOOTH EXTRACTION      Family History  Problem Relation Age of Onset  . Diabetes Mother   . Depression Mother   . Colon cancer Father        34  . Liver cancer Father   . Thyroid disease Brother   . Migraines Son   . Diabetes Maternal Grandmother   . Colon cancer Paternal Grandfather     Allergies  Allergen Reactions  . Erythromycin Palpitations  . Keflex [Cephalexin] Hives    Current Medications:   Current Outpatient Medications:  .  levothyroxine (SYNTHROID) 75 MCG tablet, Take 1 tablet (75 mcg total) by mouth daily., Disp: 90 tablet, Rfl: 1 .  LORazepam (ATIVAN) 1 MG tablet, Take 1 tablet (1 mg total) by mouth at bedtime., Disp: 90 tablet, Rfl: 1 .  sertraline (ZOLOFT) 50 MG tablet, Take 1 tablet (50 mg total) by mouth daily., Disp: 90 tablet, Rfl: 1 .  valACYclovir (VALTREX) 1000 MG tablet, Take two tablets ( total 2000 mg) by mouth q12h x 1 day; Start: ASAP after symptom onset, Disp: 6 tablet, Rfl: 2 .  Vitamin D, Ergocalciferol, 2000 units CAPS, Take 3 capsules by mouth daily. , Disp: , Rfl:    Review of Systems:   ROS  Vitals:   There were no vitals filed for this visit.   There is no height or weight on file to calculate BMI.  Physical Exam:   Physical Exam  Results for orders placed or performed in visit on 12/02/19  TSH  Result Value Ref Range   TSH 0.33 (L) 0.35  - 4.50 uIU/mL    Assessment and Plan:   There are no diagnoses linked to this encounter.  . Reviewed expectations re: course of current medical issues. . Discussed self-management of symptoms. . Outlined signs and symptoms indicating need for more acute intervention. . Patient verbalized understanding and all questions were answered. . See orders for this visit as documented in the electronic medical record. . Patient received an After-Visit Summary.  ***  Jarold Motto, PA-C

## 2020-01-29 ENCOUNTER — Other Ambulatory Visit: Payer: Self-pay

## 2020-01-29 ENCOUNTER — Other Ambulatory Visit: Payer: Self-pay | Admitting: Occupational Medicine

## 2020-01-29 ENCOUNTER — Ambulatory Visit: Payer: Self-pay

## 2020-01-29 DIAGNOSIS — M25561 Pain in right knee: Secondary | ICD-10-CM

## 2020-03-05 ENCOUNTER — Encounter (HOSPITAL_COMMUNITY): Payer: Self-pay

## 2020-03-05 ENCOUNTER — Emergency Department (HOSPITAL_COMMUNITY): Payer: 59

## 2020-03-05 ENCOUNTER — Emergency Department (HOSPITAL_COMMUNITY)
Admission: EM | Admit: 2020-03-05 | Discharge: 2020-03-05 | Disposition: A | Discharge status: Home / Self Care | Payer: 59 | Attending: Emergency Medicine | Admitting: Emergency Medicine

## 2020-03-05 ENCOUNTER — Other Ambulatory Visit: Payer: Self-pay

## 2020-03-05 DIAGNOSIS — R1030 Lower abdominal pain, unspecified: Secondary | ICD-10-CM | POA: Diagnosis not present

## 2020-03-05 DIAGNOSIS — R079 Chest pain, unspecified: Secondary | ICD-10-CM | POA: Diagnosis present

## 2020-03-05 DIAGNOSIS — R110 Nausea: Secondary | ICD-10-CM | POA: Insufficient documentation

## 2020-03-05 DIAGNOSIS — Z79899 Other long term (current) drug therapy: Secondary | ICD-10-CM | POA: Diagnosis not present

## 2020-03-05 DIAGNOSIS — E039 Hypothyroidism, unspecified: Secondary | ICD-10-CM | POA: Diagnosis not present

## 2020-03-05 DIAGNOSIS — R0789 Other chest pain: Secondary | ICD-10-CM | POA: Diagnosis not present

## 2020-03-05 LAB — BASIC METABOLIC PANEL
Anion gap: 12 (ref 5–15)
BUN: 8 mg/dL (ref 6–20)
CO2: 24 mmol/L (ref 22–32)
Calcium: 9.6 mg/dL (ref 8.9–10.3)
Chloride: 104 mmol/L (ref 98–111)
Creatinine, Ser: 0.68 mg/dL (ref 0.44–1.00)
GFR, Estimated: >60 mL/min (ref >60)
Glucose, Bld: 92 mg/dL (ref 70–99)
Potassium: 4 mmol/L (ref 3.5–5.1)
Sodium: 140 mmol/L (ref 135–145)

## 2020-03-05 LAB — CBC
HCT: 40.9 % (ref 36.0–46.0)
Hemoglobin: 13.1 g/dL (ref 12.0–15.0)
MCH: 30 pg (ref 26.0–34.0)
MCHC: 32 g/dL (ref 30.0–36.0)
MCV: 93.8 fL (ref 80.0–100.0)
Platelets: 328 10*3/uL (ref 150–400)
RBC: 4.36 MIL/uL (ref 3.87–5.11)
RDW: 12.7 % (ref 11.5–15.5)
WBC: 5.5 10*3/uL (ref 4.0–10.5)
nRBC: 0 % (ref 0.0–0.2)

## 2020-03-05 LAB — TROPONIN I (HIGH SENSITIVITY)
Troponin I (High Sensitivity): <2 ng/L (ref <18)
Troponin I (High Sensitivity): <2 ng/L (ref <18)

## 2020-03-05 LAB — I-STAT BETA HCG BLOOD, ED (MC, WL, AP ONLY): I-stat hCG, quantitative: <5 m[IU]/mL (ref <5)

## 2020-03-05 NOTE — Discharge Instructions (Signed)
Your markers of heart damage were negative.  Follow up with your family doctor in the office.  Return for worsening pain, or pain that occurs when you exert yourself.   Take 4 over the counter ibuprofen tablets 3 times a day or 2 over-the-counter naproxen tablets twice a day for pain. Also take tylenol 1000mg (2 extra strength) four times a day.

## 2020-03-05 NOTE — ED Triage Notes (Addendum)
Pt arrives to ED w/ c/o L sided chest pain. Pt states pain started at her L shoulder blade 5 days ago and has worked it's way around to her L chest. Pt endorses isolated episode of nausea today, denies sob. Pt denies cardiac hx.

## 2020-03-05 NOTE — ED Provider Notes (Signed)
[facility] EMERGENCY DEPARTMENT Provider Note   CSN: [id number] Arrival date & time: 03/05/20  1144     History Chief Complaint  Patient presents with  . Chest Pain    Jessica Juarez is a 49 y.o. female.  49 yo F with a cc of chest pain.  This actually started in her back and she feels like it is moved to the front.  Dull pain.  Worse with movement and twisting.  Feels like she pulled a muscle but now she felt a little bit nauseated and was concerned it was in her chest and so came to the ED for evaluation.  Started about 4 days ago.  Denies trauma to the area.  Denies cough congestion or fever.  Has had some lower abdominal pain off and on.    Patient denies history of MI, denies hypertension hyperlipidemia diabetes or smoking.  Denies family history of MI.  Patient denies history of PE or DVT denies hemoptysis denies unilateral lower extremity edema denies recent surgery immobilization hospitalization estrogen use or history of cancer.   The history is provided by the patient.  Chest Pain Pain location:  L chest Pain quality: sharp   Pain radiates to:  Does not radiate Pain severity:  Moderate Onset quality:  Gradual Duration:  4 days Timing:  Constant Progression:  Worsening Chronicity:  New Context: movement   Relieved by:  Nothing Worsened by:  Nothing Ineffective treatments:  None tried Associated symptoms: abdominal pain and nausea   Associated symptoms: no dizziness, no fever, no headache, no palpitations, no shortness of breath and no vomiting        Past Medical History:  Diagnosis Date  . Anxiety   . Heart murmur   . Thyroid disease     Patient Active Problem List   Diagnosis Date Noted  . Vitamin D deficiency 08/07/2017  . Constipation 08/07/2017  . Anxiety 08/07/2017  . Situational depression 08/07/2017  . Herpes simplex labialis 08/07/2017  . Hypothyroidism 08/07/2017    Past Surgical History:  Procedure Laterality Date  .  CESAREAN SECTION    . EYE SURGERY    . TUBAL LIGATION    . WISDOM TOOTH EXTRACTION       OB History   No obstetric history on file.     Family History  Problem Relation Age of Onset  . Diabetes Mother   . Depression Mother   . Colon cancer Father        77  . Liver cancer Father   . Thyroid disease Brother   . Migraines Son   . Diabetes Maternal Grandmother   . Colon cancer Paternal Grandfather     Social History   Tobacco Use  . Smoking status: Never Smoker  . Smokeless tobacco: Never Used  Vaping Use  . Vaping Use: Never used  Substance Use Topics  . Alcohol use: Yes  . Drug use: No    Home Medications Prior to Admission medications   Medication Sig Start Date End Date Taking? Authorizing Provider  levothyroxine (SYNTHROID) 75 MCG tablet Take 1 tablet (75 mcg total) by mouth daily. 12/03/19   Jarold Motto, PA  LORazepam (ATIVAN) 1 MG tablet Take 1 tablet (1 mg total) by mouth at bedtime. 12/03/19   Jarold Motto, PA  sertraline (ZOLOFT) 50 MG tablet Take 1 tablet (50 mg total) by mouth daily. 12/03/19   Jarold Motto, PA  valACYclovir (VALTREX) 1000 MG tablet Take two tablets ( total 2000 mg)  by mouth q12h x 1 day; Start: ASAP after symptom onset 12/16/18   Jarold Motto, PA  Vitamin D, Ergocalciferol, 2000 units CAPS Take 3 capsules by mouth daily.     [provider]    Allergies    Erythromycin and Keflex [cephalexin]  Review of Systems   Review of Systems  Constitutional: Negative for chills and fever.  HENT: Negative for congestion and rhinorrhea.   Eyes: Negative for redness and visual disturbance.  Respiratory: Negative for shortness of breath and wheezing.   Cardiovascular: Positive for chest pain. Negative for palpitations.  Gastrointestinal: Positive for abdominal pain and nausea. Negative for vomiting.  Genitourinary: Negative for dysuria and urgency.  Musculoskeletal: Negative for arthralgias and myalgias.  Skin: Negative for  pallor and wound.  Neurological: Negative for dizziness and headaches.    Physical Exam Updated Vital Signs BP 126/82 (BP Location: Right Arm)   Pulse 60   Temp 98.2 F (36.8 C) (Oral)   Resp 17   Ht 5' (1.524 m)   Wt 50.8 kg   SpO2 98%   BMI 21.87 kg/m   Physical Exam Vitals and nursing note reviewed.  Constitutional:      General: She is not in acute distress.    Appearance: She is well-developed. She is not diaphoretic.  HENT:     Head: Normocephalic and atraumatic.  Eyes:     Pupils: Pupils are equal, round, and reactive to light.  Cardiovascular:     Rate and Rhythm: Normal rate and regular rhythm.     Heart sounds: No murmur heard.  No friction rub. No gallop.   Pulmonary:     Effort: Pulmonary effort is normal.     Breath sounds: No wheezing or rales.  Chest:     Chest wall: Tenderness present.     Comments: Palpation of the left lateral chest wall about the anterior clavicular line ribs 5 through 8 reproduce the patient's symptoms. Abdominal:     General: There is no distension.     Palpations: Abdomen is soft.     Tenderness: There is no abdominal tenderness.  Musculoskeletal:        General: No tenderness.     Cervical back: Normal range of motion and neck supple.  Skin:    General: Skin is warm and dry.  Neurological:     Mental Status: She is alert and oriented to person, place, and time.  Psychiatric:        Behavior: Behavior normal.     ED Results / Procedures / Treatments   Labs (all labs ordered are listed, but only abnormal results are displayed) Labs Reviewed  BASIC METABOLIC PANEL  CBC  I-STAT BETA HCG BLOOD, ED ([facility], [facility], [facility] ONLY)  TROPONIN I (HIGH SENSITIVITY)  TROPONIN I (HIGH SENSITIVITY)    EKG EKG Interpretation  Date/Time:  Friday March 05 2020 11:53:14 EDT Ventricular Rate:  77 PR Interval:  132 QRS Duration: 76 QT Interval:  364 QTC Calculation: 411 R Axis:   84 Text Interpretation: Normal sinus rhythm Nonspecific T  wave abnormality Confirmed by Cathren Laine ([id number]) on 03/05/2020 2:27:59 PM   Radiology DG Chest 2 View  Result Date: 03/05/2020 CLINICAL DATA:  Chest pain. EXAM: CHEST - 2 VIEW COMPARISON:  None. FINDINGS: The heart size and mediastinal contours are within normal limits. Both lungs are clear. No pneumothorax or pleural effusion is noted. The visualized skeletal structures are unremarkable. IMPRESSION: No active cardiopulmonary disease. Electronically Signed   By: Fayrene Fearing  Christen Butter M.D.   On: 03/05/2020 12:27    Procedures Procedures (including critical care time)  Medications Ordered in ED Medications - No data to display  ED Course  I have reviewed the triage vital signs and the nursing notes.  Pertinent labs & imaging results that were available during my care of the patient were reviewed by me and considered in my medical decision making (see chart for details).    MDM Rules/Calculators/A&P                          49 yo F with a chief complaint of chest pain.  This has been going on for about a week now.  Left-sided and reproduced on exam.  She had a delta troponin that is negative.  Her EKG with nonspecific biphasic T waves in lead III and aVF, likely on previous though obscured by artifact.  Chest x-ray viewed by me without focal infiltrate or pneumothorax.  Feel her symptoms are completely atypical of pulmonary embolism.  Will discharge the patient home.  PCP follow-up.  Trial of Tylenol and NSAIDs.  3:53 PM:  I have discussed the diagnosis/risks/treatment options with the patient and family and believe the pt to be eligible for discharge home to follow-up with PCP. We also discussed returning to the ED immediately if new or worsening sx occur. We discussed the sx which are most concerning (e.g., sudden worsening pain, fever, inability to tolerate by mouth) that necessitate immediate return. Medications administered to the patient during their visit and any new prescriptions provided  to the patient are listed below.  Medications given during this visit Medications - No data to display   The patient appears reasonably screen and/or stabilized for discharge and I doubt any other medical condition or other [facility] requiring further screening, evaluation, or treatment in the ED at this time prior to discharge.   Final Clinical Impression(s) / ED Diagnoses Final diagnoses:  Chest wall pain    Rx / DC Orders ED Discharge Orders    None       Melene Plan, DO 03/05/20 1553

## 2020-03-18 ENCOUNTER — Other Ambulatory Visit: Payer: Self-pay | Admitting: Physician Assistant

## 2020-05-24 ENCOUNTER — Other Ambulatory Visit: Payer: Self-pay | Admitting: Physician Assistant

## 2020-05-24 ENCOUNTER — Telehealth: Payer: Self-pay

## 2020-05-24 DIAGNOSIS — F5102 Adjustment insomnia: Secondary | ICD-10-CM

## 2020-05-24 MED ORDER — LORAZEPAM 1 MG PO TABS: 1.0000 mg | ORAL_TABLET | Freq: Every day | ORAL | 0 refills | Status: DC

## 2020-05-24 NOTE — Telephone Encounter (Signed)
Spoke to pt told her Jessica Juarez has refilled her medication x 3 months. Needs appt for further refill. Pt verbalized understanding.

## 2020-05-24 NOTE — Telephone Encounter (Signed)
..   LAST APPOINTMENT DATE: 11/2019 NEXT APPOINTMENT DATE:@Visit  date not found  MEDICATION:LORazepam (ATIVAN) 1 MG tablet    PHARMACY:[id number] - [place], [place] - [address]  **Let patient know to contact pharmacy at the end of the day to make sure medication is ready. **  ** Please notify patient to allow 48-72 hours to process**  **Encourage patient to contact the pharmacy for refills or they can request refills through [facility]**  CLINICAL FILLS OUT ALL BELOW:   LAST REFILL:  QTY:  REFILL DATE:    OTHER COMMENTS:    Okay for refill?  Please advise

## 2020-05-24 NOTE — Telephone Encounter (Signed)
I have refilled x 3 months. Needs appt for further refill.

## 2020-05-24 NOTE — Telephone Encounter (Signed)
Pt requesting refill for Lorazepam 1 mg. Last OV 11/2019.

## 2020-05-30 ENCOUNTER — Other Ambulatory Visit: Payer: Self-pay | Admitting: Physician Assistant

## 2020-05-30 DIAGNOSIS — F5102 Adjustment insomnia: Secondary | ICD-10-CM

## 2020-08-16 ENCOUNTER — Other Ambulatory Visit: Payer: Self-pay | Admitting: Physician Assistant

## 2020-08-17 ENCOUNTER — Encounter: Payer: Self-pay | Admitting: Physician Assistant

## 2020-08-17 ENCOUNTER — Telehealth (INDEPENDENT_AMBULATORY_CARE_PROVIDER_SITE_OTHER): Payer: 59 | Admitting: Physician Assistant

## 2020-08-17 DIAGNOSIS — E039 Hypothyroidism, unspecified: Secondary | ICD-10-CM

## 2020-08-17 DIAGNOSIS — F5102 Adjustment insomnia: Secondary | ICD-10-CM

## 2020-08-17 DIAGNOSIS — G8929 Other chronic pain: Secondary | ICD-10-CM

## 2020-08-17 DIAGNOSIS — B001 Herpesviral vesicular dermatitis: Secondary | ICD-10-CM

## 2020-08-17 DIAGNOSIS — F419 Anxiety disorder, unspecified: Secondary | ICD-10-CM

## 2020-08-17 DIAGNOSIS — M5440 Lumbago with sciatica, unspecified side: Secondary | ICD-10-CM

## 2020-08-17 MED ORDER — CYCLOBENZAPRINE HCL 10 MG PO TABS: 10.0000 mg | ORAL_TABLET | Freq: Every day | ORAL | 1 refills | Status: DC

## 2020-08-17 MED ORDER — SERTRALINE HCL 50 MG PO TABS: 50.0000 mg | ORAL_TABLET | Freq: Every day | ORAL | 3 refills | Status: DC

## 2020-08-17 MED ORDER — LORAZEPAM 1 MG PO TABS: 1.0000 mg | ORAL_TABLET | Freq: Every day | ORAL | 1 refills | Status: DC

## 2020-08-17 MED ORDER — VALACYCLOVIR HCL 500 MG PO TABS: 500.0000 mg | ORAL_TABLET | Freq: Every day | ORAL | 1 refills | Status: DC

## 2020-08-17 NOTE — Progress Notes (Signed)
Virtual Visit via Video   I connected with Jessica Juarez on 08/17/20 at  1:00 PM EDT by a video enabled telemedicine application and verified that I am speaking with the correct person using two identifiers. Location patient: Home Location provider: [facility], Office Persons participating in the virtual visit: Sarahelizabeth Conway, Jarold Motto PA-C, Corky Mull, LPN   I discussed the limitations of evaluation and management by telemedicine and the availability of in person appointments. The patient expressed understanding and agreed to proceed.  I acted as a Neurosurgeon for [employer], PA-C [employer], LPN   Subjective:   HPI:   Anxiety / Depression / Insomnia Currently taking Zoloft 50 mg daily and Ativan 1 mg daily at bedtime. Pt is doing well. Denies SI/HI. She feels stable on this medication and is tolerating well.  Hypothyroidism She is overdue for TSH recheck. Currently taking levothyroxine 75 mcg daily. Denies concerns regarding weight, energy level, but last TSH in July was abnormal and never updated.  Lab Results  Component Value Date   TSH 0.33 (L) 12/02/2019   Herpes simplex She is having rare outbreaks. When she takes 1000 mg valtrex she has diarrhea, would like to trial a lower dose to see if that is effective and better tolerated.  Back pain She is taking flexeril 10 mg prn for her back pain from back injury in August. This medication works well for her, denies sedation or other side effects. Sees ortho regularly for her back 2/2 workmans comp injury. Denies any new or worrisome back symptoms such as weakness or incontinence.  ROS: See pertinent positives and negatives per HPI.  Patient Active Problem List   Diagnosis Date Noted  . Vitamin D deficiency 08/07/2017  . Constipation 08/07/2017  . Anxiety 08/07/2017  . Situational depression 08/07/2017  . Herpes simplex labialis 08/07/2017  . Hypothyroidism 08/07/2017    Social History   Tobacco Use  .  Smoking status: Never Smoker  . Smokeless tobacco: Never Used  Substance Use Topics  . Alcohol use: Yes    Current Outpatient Medications:  .  levothyroxine (SYNTHROID) 75 MCG tablet, Take 1 tablet (75 mcg total) by mouth daily., Disp: 90 tablet, Rfl: 1 .  valACYclovir (VALTREX) 500 MG tablet, Take 1 tablet (500 mg total) by mouth daily., Disp: 30 tablet, Rfl: 1 .  Vitamin D, Ergocalciferol, 2000 units CAPS, Take 3 capsules by mouth daily. , Disp: , Rfl:  .  cyclobenzaprine (FLEXERIL) 10 MG tablet, Take 1 tablet (10 mg total) by mouth at bedtime., Disp: 30 tablet, Rfl: 1 .  LORazepam (ATIVAN) 1 MG tablet, Take 1 tablet (1 mg total) by mouth at bedtime., Disp: 90 tablet, Rfl: 1 .  sertraline (ZOLOFT) 50 MG tablet, Take 1 tablet (50 mg total) by mouth daily., Disp: 90 tablet, Rfl: 3  Allergies  Allergen Reactions  . Erythromycin Palpitations  . Keflex [Cephalexin] Hives    Objective:   VITALS: Per patient if applicable, see vitals. GENERAL: Alert, appears well and in no acute distress. HEENT: Atraumatic, conjunctiva clear, no obvious abnormalities on inspection of external nose and ears. NECK: Normal movements of the head and neck. CARDIOPULMONARY: No increased WOB. Speaking in clear sentences. I:E ratio WNL.  MS: Moves all visible extremities without noticeable abnormality. PSYCH: Pleasant and cooperative, well-groomed. Speech normal rate and rhythm. Affect is appropriate. Insight and judgement are appropriate. Attention is focused, linear, and appropriate.  NEURO: CN grossly intact. Oriented as arrived to appointment on time with no  prompting. Moves both UE equally.  SKIN: No obvious lesions, wounds, erythema, or cyanosis noted on face or hands.  Assessment and Plan:   Clariza was seen today for anxiety and depression.  Diagnoses and all orders for this visit:  Anxiety Well controlled. Continue zoloft 50 mg daily. Follow-up in 1 year, sooner if concerns.  Adjustment  insomnia Well controlled. Continue ativan 1 mg daily. Follow-up in 6 months, sooner if concerns. -     LORazepam (ATIVAN) 1 MG tablet; Take 1 tablet (1 mg total) by mouth at bedtime.  Hypothyroidism, unspecified type Update TSH, she plans to return for this later this week. Will adjust levothyroxine 75 mcg based on results. -     TSH; Future  Herpes simplex labialis Trial lower dose to see if improved GI side effects. 500 mg valtrex tabs sent in. Follow-up if remains issue with tolerance.  Chronic midline low back pain with sciatica, sciatica laterality unspecified Mgmt per ortho but did provide refill of flexeril today.  Other orders -     sertraline (ZOLOFT) 50 MG tablet; Take 1 tablet (50 mg total) by mouth daily. -     valACYclovir (VALTREX) 500 MG tablet; Take 1 tablet (500 mg total) by mouth daily. -     cyclobenzaprine (FLEXERIL) 10 MG tablet; Take 1 tablet (10 mg total) by mouth at bedtime.  I discussed the assessment and treatment plan with the patient. The patient was provided an opportunity to ask questions and all were answered. The patient agreed with the plan and demonstrated an understanding of the instructions.   The patient was advised to call back or seek an in-person evaluation if the symptoms worsen or if the condition fails to improve as anticipated.   CMA or LPN served as scribe during this visit. History, Physical, and Plan performed by medical provider. The above documentation has been reviewed and is accurate and complete.   [place], [place] 08/17/2020

## 2020-09-08 ENCOUNTER — Other Ambulatory Visit: Payer: Self-pay | Admitting: Physician Assistant

## 2020-11-24 ENCOUNTER — Other Ambulatory Visit: Payer: Self-pay | Admitting: Physician Assistant

## 2020-12-24 ENCOUNTER — Other Ambulatory Visit: Payer: Self-pay | Admitting: Family Medicine

## 2021-01-15 ENCOUNTER — Other Ambulatory Visit: Payer: Self-pay | Admitting: Physician Assistant

## 2021-01-17 ENCOUNTER — Other Ambulatory Visit: Payer: Self-pay | Admitting: Family Medicine

## 2021-01-31 ENCOUNTER — Other Ambulatory Visit: Payer: Self-pay | Admitting: Physician Assistant

## 2021-02-15 ENCOUNTER — Other Ambulatory Visit: Payer: Self-pay

## 2021-02-15 DIAGNOSIS — F5102 Adjustment insomnia: Secondary | ICD-10-CM

## 2021-02-15 NOTE — Telephone Encounter (Signed)
LAST APPOINTMENT DATE:  08/17/20  NEXT APPOINTMENT DATE: none  MEDICATION:LORazepam (ATIVAN) 1 MG tablet  PHARMACY:[id number] - [place], [place] - [address]  **Pt would like a call when filled

## 2021-02-16 MED ORDER — LORAZEPAM 1 MG PO TABS: 1.0000 mg | ORAL_TABLET | Freq: Every day | ORAL | 0 refills | Status: DC

## 2021-02-16 NOTE — Telephone Encounter (Signed)
I am sending in 1 more month of medication, but patient needs a visit before the prescription runs out for new fills.

## 2021-02-16 NOTE — Telephone Encounter (Signed)
Pt requesting refill for Lorazepam. Pt is overdue for physical. Last OV 07/2020.

## 2021-02-16 NOTE — Telephone Encounter (Signed)
Spoke to pt told her one month supply was sent to the pharmacy but she needs to schedule physical before Rx runs out. Pt verbalized understanding and said she does not have insurance hopefully will have insurance in a month. Told pt okay if not please let me know. Pt verbalized understanding.

## 2021-03-18 ENCOUNTER — Other Ambulatory Visit: Payer: Self-pay | Admitting: Physician Assistant

## 2021-03-18 ENCOUNTER — Other Ambulatory Visit: Payer: Self-pay

## 2021-03-18 DIAGNOSIS — F5102 Adjustment insomnia: Secondary | ICD-10-CM

## 2021-03-18 MED ORDER — LORAZEPAM 1 MG PO TABS: 1.0000 mg | ORAL_TABLET | Freq: Every day | ORAL | 0 refills | Status: DC

## 2021-03-18 NOTE — Telephone Encounter (Signed)
LAST APPOINTMENT DATE:  08/17/20  NEXT APPOINTMENT DATE: None  MEDICATION:LORazepam (ATIVAN) 1 MG tablet  [facility]ACY: [id number] [place], [place] - [address]   **Pt stated that she is supposed to be seen before refill, pt still does not have insurance.

## 2021-03-18 NOTE — Telephone Encounter (Signed)
Pt requesting refill on Lorazepam. She still does not have insurance.

## 2021-04-18 ENCOUNTER — Other Ambulatory Visit: Payer: Self-pay

## 2021-04-18 DIAGNOSIS — F5102 Adjustment insomnia: Secondary | ICD-10-CM

## 2021-04-18 MED ORDER — LORAZEPAM 1 MG PO TABS: 1.0000 mg | ORAL_TABLET | Freq: Every day | ORAL | 1 refills | Status: DC

## 2021-04-18 NOTE — Telephone Encounter (Signed)
Pt requesting refill Lorazepam 1 mg. She will not have insurance till January.

## 2021-04-18 NOTE — Telephone Encounter (Signed)
Duplicate

## 2021-04-18 NOTE — Telephone Encounter (Signed)
LAST APPOINTMENT DATE:  08/17/20  NEXT APPOINTMENT DATE: None  MEDICATION:LORazepam (ATIVAN) 1 MG tablet  PH[facility]ACY: [id number] - [place], [place] - [address]   **Pt stated that she will not have insurance until January. She stated that she has enough pills until Thursday.

## 2021-06-20 ENCOUNTER — Other Ambulatory Visit: Payer: Self-pay

## 2021-06-20 DIAGNOSIS — F5102 Adjustment insomnia: Secondary | ICD-10-CM

## 2021-06-20 MED ORDER — LORAZEPAM 1 MG PO TABS: 1.0000 mg | ORAL_TABLET | Freq: Every day | ORAL | 1 refills | Status: DC

## 2021-06-20 NOTE — Telephone Encounter (Signed)
Pt requesting refill for Lorazepam 1 mg. Please see message from patient.

## 2021-06-20 NOTE — Telephone Encounter (Signed)
Patient requesting a refill on her Lorazepam. Jessica Juarez she is uninsured for this month but should be insured next month to come in for a visit if needed.

## 2021-06-25 ENCOUNTER — Emergency Department (HOSPITAL_BASED_OUTPATIENT_CLINIC_OR_DEPARTMENT_OTHER)
Admission: EM | Admit: 2021-06-25 | Discharge: 2021-06-25 | Disposition: A | Discharge status: Home / Self Care | Payer: 59 | Attending: Emergency Medicine | Admitting: Emergency Medicine

## 2021-06-25 ENCOUNTER — Emergency Department (HOSPITAL_BASED_OUTPATIENT_CLINIC_OR_DEPARTMENT_OTHER): Payer: 59 | Admitting: Radiology

## 2021-06-25 ENCOUNTER — Encounter (HOSPITAL_BASED_OUTPATIENT_CLINIC_OR_DEPARTMENT_OTHER): Payer: Self-pay

## 2021-06-25 ENCOUNTER — Other Ambulatory Visit: Payer: Self-pay

## 2021-06-25 DIAGNOSIS — R0789 Other chest pain: Secondary | ICD-10-CM | POA: Diagnosis not present

## 2021-06-25 DIAGNOSIS — J111 Influenza due to unidentified influenza virus with other respiratory manifestations: Secondary | ICD-10-CM | POA: Insufficient documentation

## 2021-06-25 DIAGNOSIS — R059 Cough, unspecified: Secondary | ICD-10-CM | POA: Insufficient documentation

## 2021-06-25 MED ORDER — PREDNISONE 10 MG (21) PO TBPK: ORAL_TABLET | Freq: Every day | ORAL | 0 refills | Status: DC

## 2021-06-25 MED ORDER — ALBUTEROL SULFATE HFA 108 (90 BASE) MCG/ACT IN AERS
1.0000 | INHALATION_SPRAY | Freq: Four times a day (QID) | RESPIRATORY_TRACT | 0 refills | Status: DC | PRN
Start: 2021-06-25 — End: 2022-08-22

## 2021-06-25 NOTE — ED Notes (Signed)
Pt did not want lab work during triage at this point due to insurance concerns.

## 2021-06-25 NOTE — ED Provider Notes (Signed)
[facility] EMERGENCY DEPT Provider Note   CSN: [id number] Arrival date & time: 06/25/21  1336     History  Chief Complaint  Patient presents with   Cough    Jessica Juarez is a 51 y.o. female.  Patient presents with cough times almost a month.  Started after she had influenza.  Denies any fever.  No hemoptysis.  No night sweats.  No vomiting or diarrhea.  Cough is worse in the morning and gets better throughout the day.  Denies any use of tobacco products.  No anginal or CHF symptoms.  No treatment use for this prior to arrival      Home Medications Prior to Admission medications   Medication Sig Start Date End Date Taking? Authorizing Provider  cyclobenzaprine (FLEXERIL) 10 MG tablet Take 1 tablet (10 mg total) by mouth at bedtime. 08/17/20   Jarold Motto, PA  levothyroxine (SYNTHROID) 75 MCG tablet TAKE 1 TABLET BY MOUTH EVERY DAY 02/01/21   Jarold Motto, PA  LORazepam (ATIVAN) 1 MG tablet Take 1 tablet (1 mg total) by mouth at bedtime. 06/20/21   Jarold Motto, PA  sertraline (ZOLOFT) 50 MG tablet TAKE 1 TABLET BY MOUTH EVERY DAY 03/18/21   Jarold Motto, PA  valACYclovir (VALTREX) 500 MG tablet TAKE 1 TABLET (500 MG TOTAL) BY MOUTH DAILY. 09/13/20   Jarold Motto, PA  Vitamin D, Ergocalciferol, 2000 units CAPS Take 3 capsules by mouth daily.     [provider]      Allergies    Erythromycin and Keflex [cephalexin]    Review of Systems   Review of Systems  All other systems reviewed and are negative.  Physical Exam Updated Vital Signs BP 125/77    Pulse 72    Temp 97.9 F (36.6 C)    Resp 16    Ht 1.524 m (5')    Wt 52.6 kg    LMP 01/19/2020    SpO2 99%    BMI 22.65 kg/m  Physical Exam Vitals and nursing note reviewed.  Constitutional:      General: She is not in acute distress.    Appearance: Normal appearance. She is well-developed. She is not toxic-appearing.  HENT:     Head: Normocephalic and atraumatic.  Eyes:      General: Lids are normal.     Conjunctiva/sclera: Conjunctivae normal.     Pupils: Pupils are equal, round, and reactive to light.  Neck:     Thyroid: No thyroid mass.     Trachea: No tracheal deviation.  Cardiovascular:     Rate and Rhythm: Normal rate and regular rhythm.     Heart sounds: Normal heart sounds. No murmur heard.   No gallop.  Pulmonary:     Effort: Pulmonary effort is normal. No respiratory distress.     Breath sounds: Normal breath sounds. No stridor. No decreased breath sounds, wheezing, rhonchi or rales.  Abdominal:     General: There is no distension.     Palpations: Abdomen is soft.     Tenderness: There is no abdominal tenderness. There is no rebound.  Musculoskeletal:        General: No tenderness. Normal range of motion.     Cervical back: Normal range of motion and neck supple.  Skin:    General: Skin is warm and dry.     Findings: No abrasion or rash.  Neurological:     Mental Status: She is alert and oriented to person, place, and time. Mental status is  at baseline.     GCS: GCS eye subscore is 4. GCS verbal subscore is 5. GCS motor subscore is 6.     Cranial Nerves: No cranial nerve deficit.     Sensory: No sensory deficit.     Motor: Motor function is intact.  Psychiatric:        Attention and Perception: Attention normal.        Speech: Speech normal.        Behavior: Behavior normal.    ED Results / Procedures / Treatments   Labs (all labs ordered are listed, but only abnormal results are displayed) Labs Reviewed - No data to display  EKG EKG Interpretation  Date/Time:  Saturday June 25 2021 13:48:19 EST Ventricular Rate:  65 PR Interval:  134 QRS Duration: 78 QT Interval:  390 QTC Calculation: 405 R Axis:   53 Text Interpretation: Normal sinus rhythm Normal ECG When compared with ECG of 05-Mar-2020 11:53, Nonspecific T wave abnormality no longer evident in Lateral leads Confirmed by Edwin Dada ([id number]) on 06/25/2021 3:04:35  PM  Radiology DG Chest 2 View  Result Date: 06/25/2021 CLINICAL DATA:  Chest pain, cough EXAM: CHEST - 2 VIEW COMPARISON:  03/05/2020 FINDINGS: The heart size and mediastinal contours are within normal limits. Both lungs are clear. The visualized skeletal structures are unremarkable. IMPRESSION: No active cardiopulmonary disease. Electronically Signed   By: Ernie Avena M.D.   On: 06/25/2021 14:53    Procedures Procedures    Medications Ordered in ED Medications - No data to display  ED Course/ Medical Decision Making/ A&P                           Medical Decision Making Amount and/or Complexity of Data Reviewed Radiology: ordered.   Chest x-ray is clear at this time.  Suspect patient is having bronchospasm.  No concern for recurrent pneumonia or cardiac etiology of her symptoms.  Low suspicion for other etiologies such as pertussis or TB low suspicion for PE.  Plan will be to give patient albuterol inhaler as well as prednisone.        Final Clinical Impression(s) / ED Diagnoses Final diagnoses:  None    Rx / DC Orders ED Discharge Orders     None         Lorre Nick, MD 06/25/21 1535

## 2021-06-25 NOTE — ED Triage Notes (Signed)
Pt reports she had the flu around new years, but she has had a persistent and worsening productive cough. Pt states she coughs so much it makes her ShOB at times, pt also reports tightness in her chest. Symptoms worse upon waking in the AM.

## 2021-06-27 ENCOUNTER — Other Ambulatory Visit: Payer: Self-pay | Admitting: Physician Assistant

## 2021-06-28 ENCOUNTER — Other Ambulatory Visit: Payer: Self-pay | Admitting: Physician Assistant

## 2021-07-07 IMAGING — CR DG CHEST 2V
2 series · 2 of 2 positions shown · non-contrast
Comparison: None.

CLINICAL DATA: Chest pain.

EXAM:
CHEST - 2 VIEW

[chest pa]
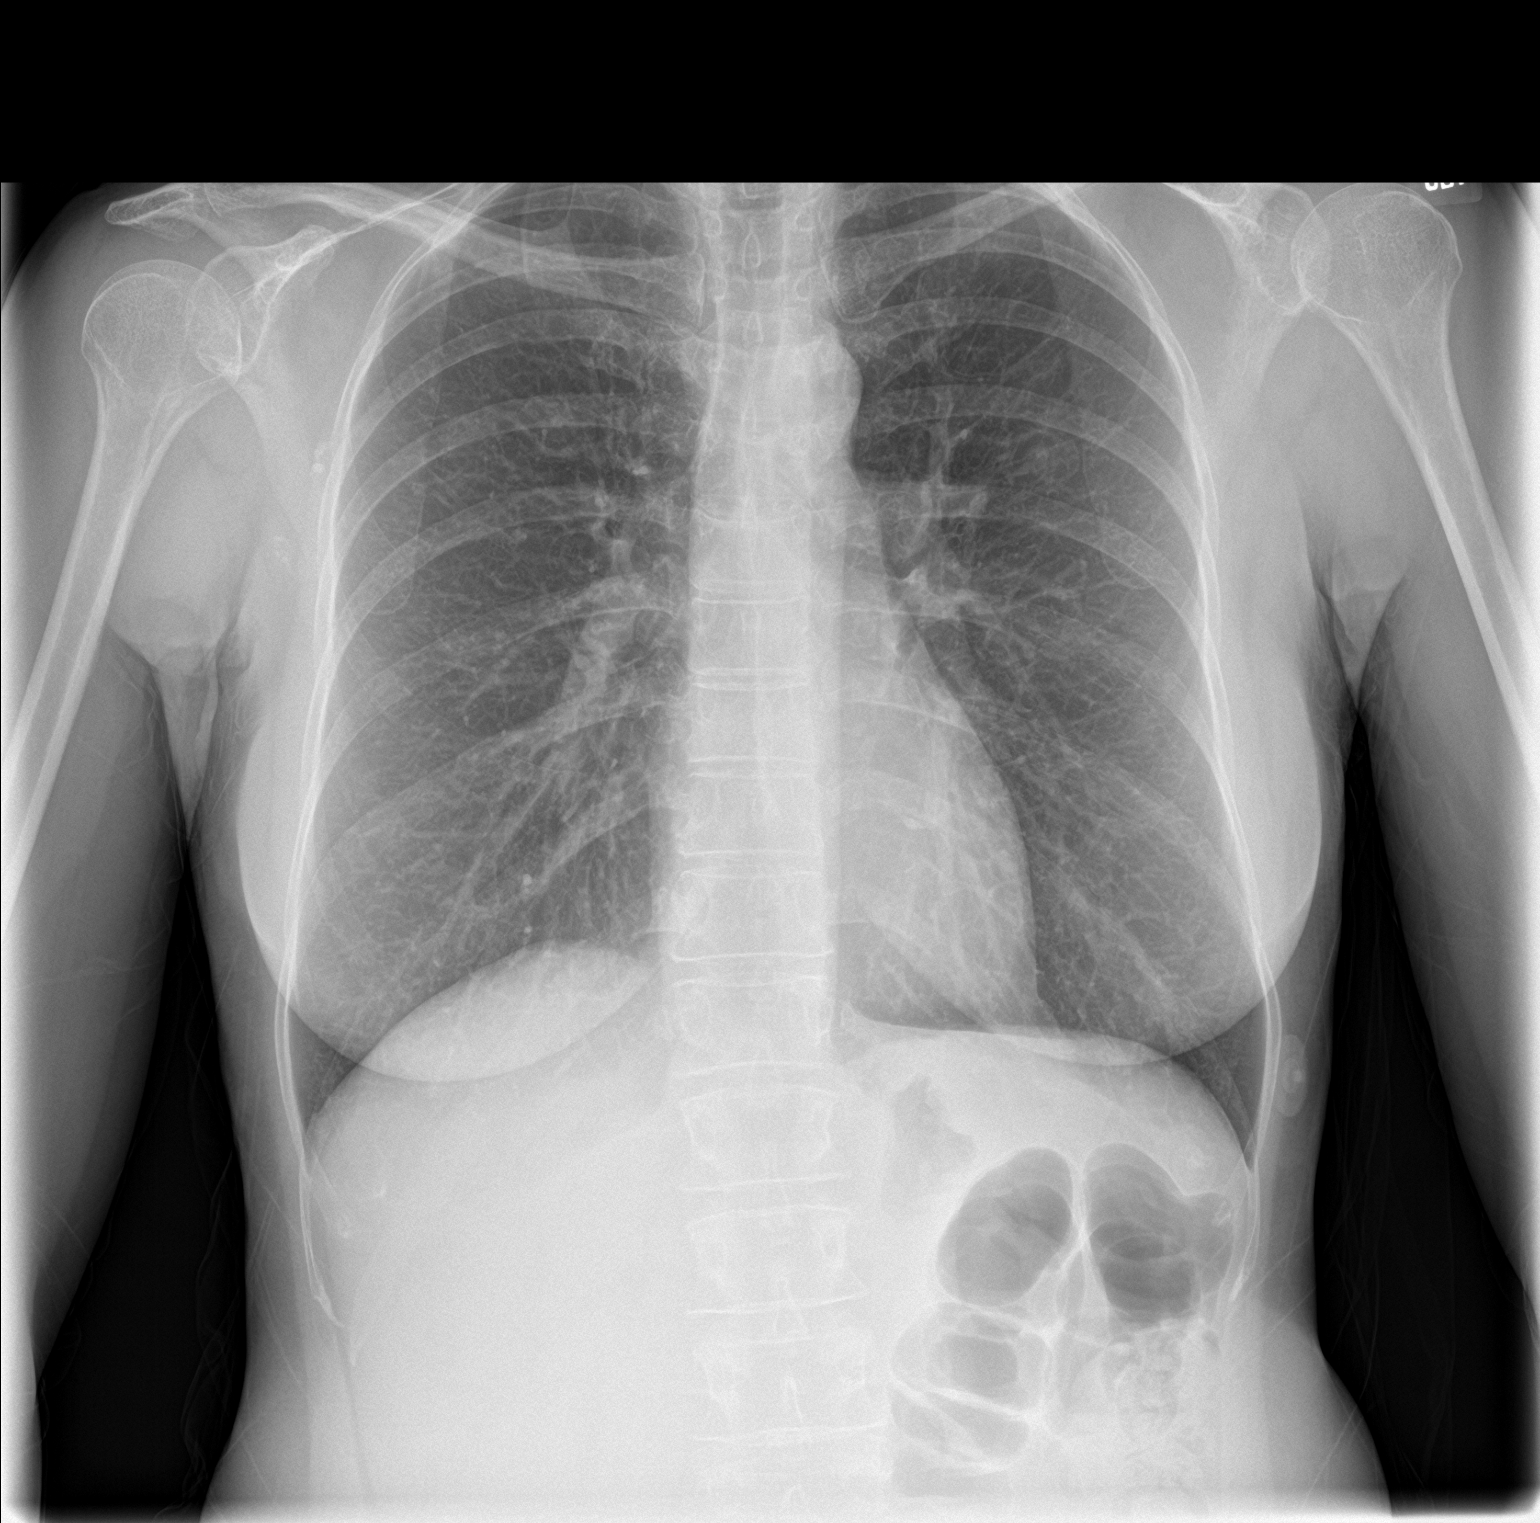

[chest lat]
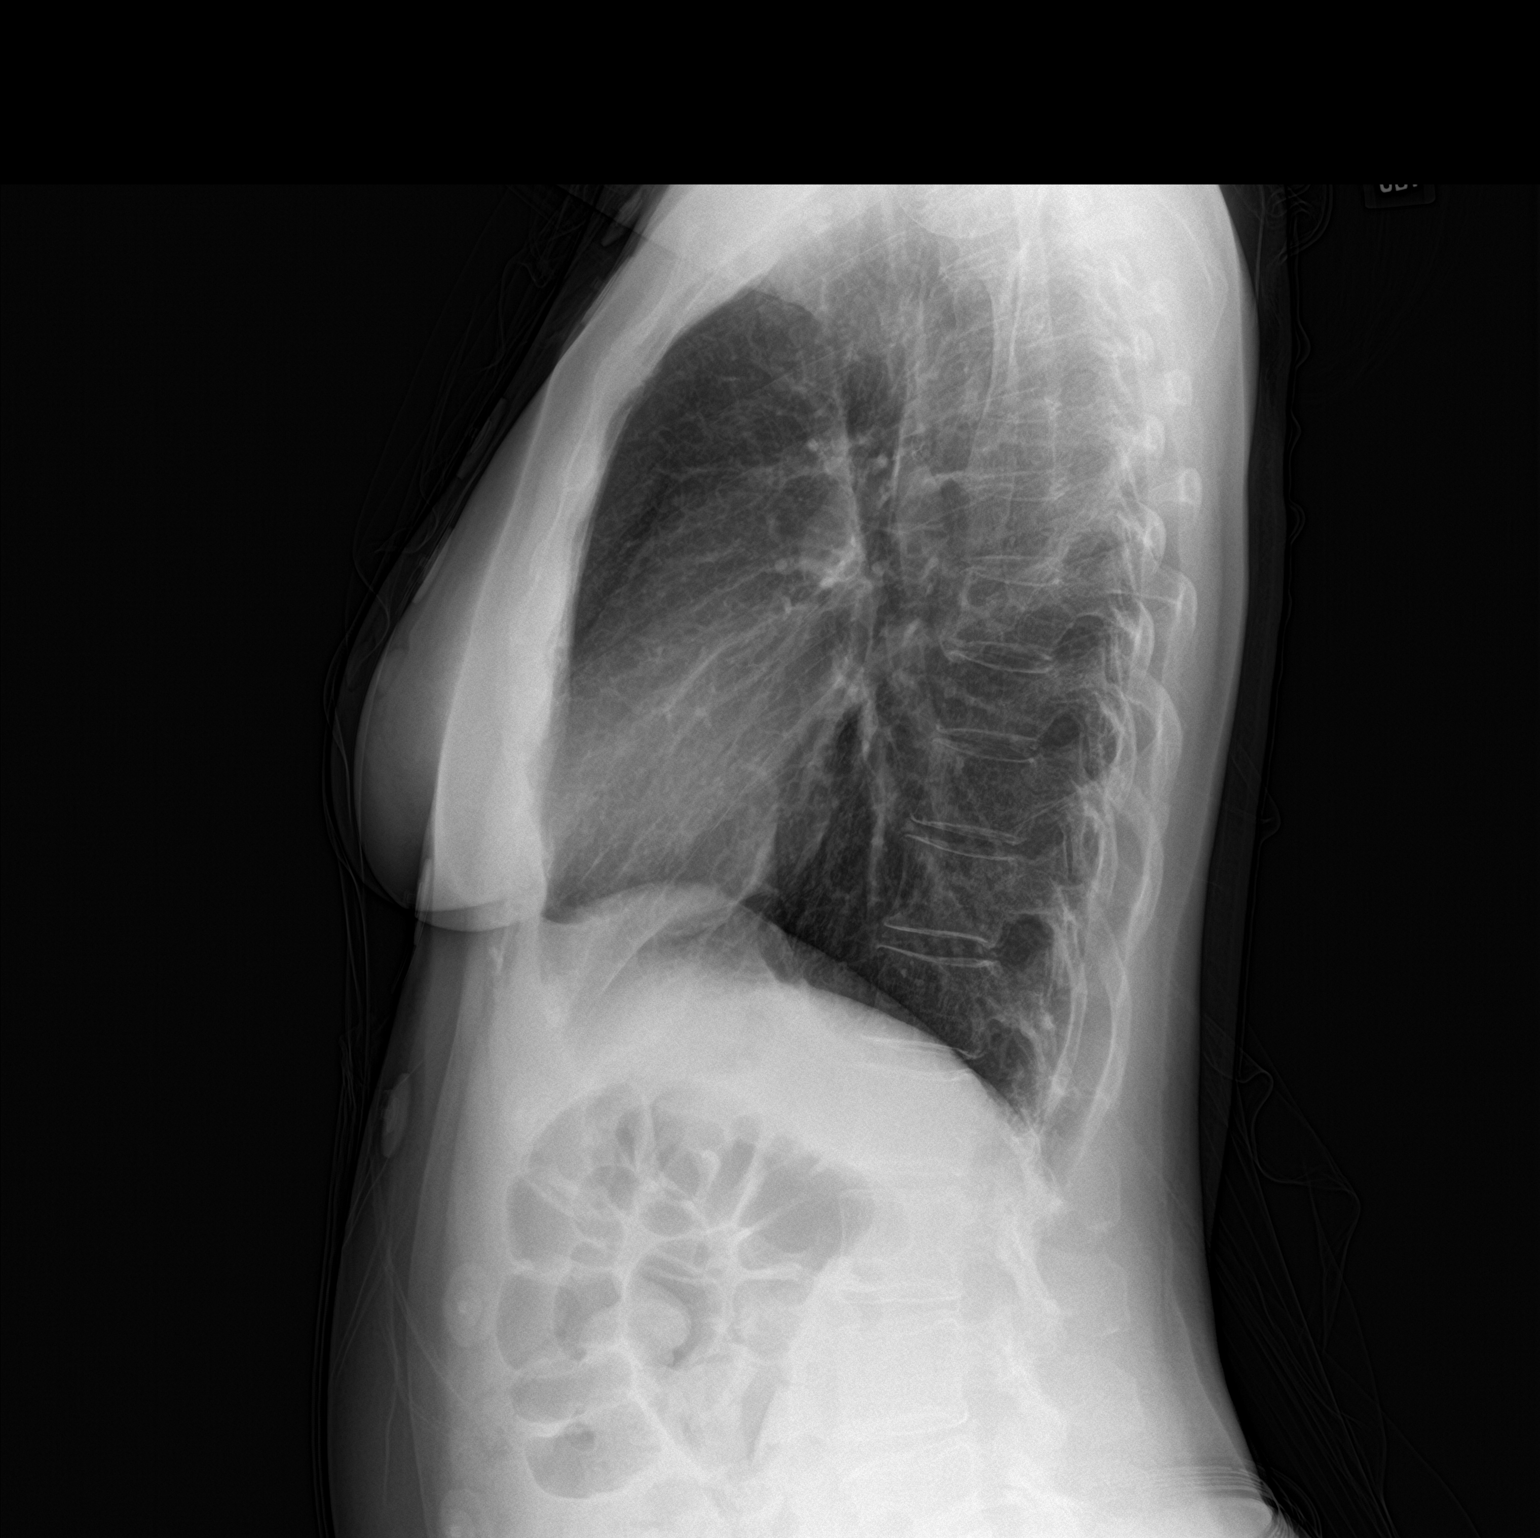

[2 of 2 positions shown; findings below may reference images not displayed]

FINDINGS: The heart size and mediastinal contours are within normal limits.
Both lungs are clear. No pneumothorax or pleural effusion is noted.
The visualized skeletal structures are unremarkable.
IMPRESSION: No active cardiopulmonary disease.

## 2021-08-16 ENCOUNTER — Telehealth: Payer: Self-pay | Admitting: Physician Assistant

## 2021-08-16 ENCOUNTER — Other Ambulatory Visit: Payer: Self-pay | Admitting: Physician Assistant

## 2021-08-16 DIAGNOSIS — F5102 Adjustment insomnia: Secondary | ICD-10-CM

## 2021-08-16 MED ORDER — LORAZEPAM 1 MG PO TABS: 1.0000 mg | ORAL_TABLET | Freq: Every day | ORAL | 1 refills | Status: DC

## 2021-08-16 NOTE — Telephone Encounter (Signed)
.. ?  Encourage patient to contact the pharmacy for refills or they can request refills through [facility] ? ?LAST APPOINTMENT DATE:  08/17/20 ? ?NEXT APPOINTMENT DATE: N/A ? ?MEDICATION:LORazepam (ATIVAN) 1 MG tablet ? ?Is the patient out of medication? yes ? ?PHARMACY: ?[id number] [place], [place] - [address] Phone:  [phone]  ?Fax:  [phone]  ?  ? ? ?Let patient know to contact pharmacy at the end of the day to make sure medication is ready. ? ?Please notify patient to allow 48-72 hours to process  ?

## 2021-10-01 ENCOUNTER — Other Ambulatory Visit: Payer: Self-pay | Admitting: Physician Assistant

## 2021-10-01 NOTE — Telephone Encounter (Signed)
Left message to return call to our office at their convenience.  ? ?Pease lese schedule OV with PCP when patient call back thanks ?

## 2021-10-12 ENCOUNTER — Encounter: Payer: Self-pay | Admitting: Physician Assistant

## 2021-10-12 ENCOUNTER — Ambulatory Visit (INDEPENDENT_AMBULATORY_CARE_PROVIDER_SITE_OTHER): Payer: Self-pay | Admitting: Physician Assistant

## 2021-10-12 VITALS — BP 98/72 | HR 94 | Temp 98.4°F | Ht 60.0 in | Wt 103.8 lb

## 2021-10-12 DIAGNOSIS — F419 Anxiety disorder, unspecified: Secondary | ICD-10-CM

## 2021-10-12 DIAGNOSIS — B001 Herpesviral vesicular dermatitis: Secondary | ICD-10-CM

## 2021-10-12 DIAGNOSIS — E039 Hypothyroidism, unspecified: Secondary | ICD-10-CM

## 2021-10-12 DIAGNOSIS — F5102 Adjustment insomnia: Secondary | ICD-10-CM

## 2021-10-12 MED ORDER — LORAZEPAM 1 MG PO TABS: 1.0000 mg | ORAL_TABLET | Freq: Every day | ORAL | 1 refills | Status: DC

## 2021-10-12 MED ORDER — SERTRALINE HCL 50 MG PO TABS: 50.0000 mg | ORAL_TABLET | Freq: Every day | ORAL | 1 refills | Status: DC

## 2021-10-12 MED ORDER — ACYCLOVIR 400 MG PO TABS: ORAL_TABLET | ORAL | 1 refills | Status: DC

## 2021-10-12 NOTE — Patient Instructions (Signed)
It was great to see you! ? ?Medications sent. ? ?We will be in touch when your thyroid has returned and if adjustment needs to be made. ? ?Take care, ? ?Jarold Motto PA-C  ?

## 2021-10-12 NOTE — Progress Notes (Signed)
Jessica Juarez is a 51 y.o. female here for a follow up of a pre-existing problem. ? ?History of Present Illness:  ? ?Chief Complaint  ?Patient presents with  ? Annual Exam  ?  Pt is not fasting; pt states she needs refill on medication, feels like thyroid is off and may be taking to much meds and requesting lab draw to check; pt needing lower dose Valtrex; Zoloft, Lorazpam, and Synthroid after blood work;   ? ? ?HPI ? ?Hypothyroidism  ?Patient reports her thyroid has been feeling little off. She has lost about 3 pounds. She has not had her menstrual cycle in 3 months. Currently taking levothyroxine 75 mcg daily. She is tolerating her medication well with no side effect. She would like to get her blood work done today.  ? ?Herpes simplex  ?Patient reports she Valtrex as needed for her symptoms. She takes Valtrex twice a day as needed for flares. Denies any side effects. She is tolerating her medication. She is managing well and requesting refill on her medication today. She states that the pills are too big and would like something smaller to take. ? ?Anxiety ?She is currently taking Zoloft 50 mg daily. She is compliant with her medication. Has been doing well. Denies any side effect. Patient is requesting refill on her medication today. Denies SI/HI.  ? ?Insomnia ?Currently taking ativan 1 mg nightly. Tolerating well. Has not missed any doses. ? ?Past Medical History:  ?Diagnosis Date  ? Anxiety   ? Heart murmur   ? Thyroid disease   ? ?  ?Social History  ? ?Tobacco Use  ? Smoking status: Never  ? Smokeless tobacco: Never  ?Vaping Use  ? Vaping Use: Never used  ?Substance Use Topics  ? Alcohol use: Yes  ? Drug use: No  ? ? ?Past Surgical History:  ?Procedure Laterality Date  ? CESAREAN SECTION    ? EYE SURGERY    ? TUBAL LIGATION    ? WISDOM TOOTH EXTRACTION    ? ? ?Family History  ?Problem Relation Age of Onset  ? Diabetes Mother   ? Depression Mother   ? Colon cancer Father   ?     53  ? Liver cancer Father   ?  Thyroid disease Brother   ? Migraines Son   ? Diabetes Maternal Grandmother   ? Colon cancer Paternal Grandfather   ? ? ?Allergies  ?Allergen Reactions  ? Erythromycin Palpitations  ? Keflex [Cephalexin] Hives  ? ? ?Current Medications:  ? ?Current Outpatient Medications:  ?  cyclobenzaprine (FLEXERIL) 10 MG tablet, Take 1 tablet (10 mg total) by mouth at bedtime., Disp: 30 tablet, Rfl: 1 ?  levothyroxine (SYNTHROID) 75 MCG tablet, TAKE 1 TABLET BY MOUTH EVERY DAY, Disp: 90 tablet, Rfl: 0 ?  LORazepam (ATIVAN) 1 MG tablet, Take 1 tablet (1 mg total) by mouth at bedtime., Disp: 30 tablet, Rfl: 1 ?  sertraline (ZOLOFT) 50 MG tablet, TAKE 1 TABLET BY MOUTH EVERY DAY, Disp: 30 tablet, Rfl: 0 ?  valACYclovir (VALTREX) 500 MG tablet, TAKE 1 TABLET (500 MG TOTAL) BY MOUTH DAILY., Disp: 90 tablet, Rfl: 1 ?  Vitamin D, Ergocalciferol, 2000 units CAPS, Take 3 capsules by mouth daily. , Disp: , Rfl:  ?  albuterol (VENTOLIN HFA) 108 (90 Base) MCG/ACT inhaler, Inhale 1-2 puffs into the lungs every 6 (six) hours as needed for wheezing or shortness of breath. (Patient not taking: Reported on 10/12/2021), Disp: 1 each, Rfl: 0 ?  predniSONE (  STERAPRED UNI-PAK 21 TAB) 10 MG (21) TBPK tablet, Take by mouth daily. Take 6 tabs by mouth daily  for 2 days, then 5 tabs for 2 days, then 4 tabs for 2 days, then 3 tabs for 2 days, 2 tabs for 2 days, then 1 tab by mouth daily for 2 days (Patient not taking: Reported on 10/12/2021), Disp: 42 tablet, Rfl: 0  ? ?Review of Systems:  ? ?ROS ?Negative unless otherwise specified per HPI.  ? ?Vitals:  ? ?Vitals:  ? 10/12/21 1449  ?BP: 98/72  ?Pulse: 94  ?Temp: 98.4 ?F (36.9 ?C)  ?TempSrc: Temporal  ?SpO2: 98%  ?Weight: 103 lb 12.8 oz (47.1 kg)  ?Height: 5' (1.524 m)  ?   ?Body mass index is 20.27 kg/m?. ? ?Physical Exam:  ? ?Physical Exam ?Vitals and nursing note reviewed.  ?Constitutional:   ?   General: She is not in acute distress. ?   Appearance: She is well-developed. She is not ill-appearing or  toxic-appearing.  ?Cardiovascular:  ?   Rate and Rhythm: Normal rate and regular rhythm.  ?   Pulses: Normal pulses.  ?   Heart sounds: Normal heart sounds, S1 normal and S2 normal.  ?Pulmonary:  ?   Effort: Pulmonary effort is normal.  ?   Breath sounds: Normal breath sounds.  ?Skin: ?   General: Skin is warm and dry.  ?Neurological:  ?   Mental Status: She is alert.  ?   GCS: GCS eye subscore is 4. GCS verbal subscore is 5. GCS motor subscore is 6.  ?Psychiatric:     ?   Speech: Speech normal.     ?   Behavior: Behavior normal. Behavior is cooperative.  ? ? ?Assessment and Plan:  ? ?Adjustment insomnia ?Well controlled ?No red flags on PDMP ?Refill ativan 1 mg x 3 months ?Follow-up in 3-6 months, sooner if concerns ? ?Hypothyroidism, unspecified type ?Update TSH and adjust 75 mcg levothyroxine as indicated ? ?Herpes simplex labialis ?Overall well controlled ?Will change medication to acyclovir for smaller pill ?Follow-up as needed ? ?Anxiety ?Well controlled ?Continue zoloft 50 mg daily ?Follow-up in 6 months, sooner if concerns ?I discussed with patient that if they develop any SI, to tell someone immediately and seek medical attention. ? ? ?I,Savera Zaman,acting as a Education administrator for [employer], PA.,have documented all relevant documentation on the behalf of Inda Coke, PA,as directed by  Inda Coke, PA while in the presence of Inda Coke, [place].  ? ?IInda Coke, PA, have reviewed all documentation for this visit. The documentation on 10/12/21 for the exam, diagnosis, procedures, and orders are all accurate and complete. ? ? ?Inda Coke, PA-C ? ?

## 2021-10-13 ENCOUNTER — Other Ambulatory Visit: Payer: Self-pay | Admitting: Physician Assistant

## 2021-10-13 ENCOUNTER — Other Ambulatory Visit: Payer: Self-pay

## 2021-10-13 DIAGNOSIS — E039 Hypothyroidism, unspecified: Secondary | ICD-10-CM

## 2021-10-13 LAB — TSH: TSH: 9.44 u[IU]/mL — ABNORMAL HIGH (ref 0.35–5.50)

## 2021-10-13 MED ORDER — LEVOTHYROXINE SODIUM 88 MCG PO TABS: 88.0000 ug | ORAL_TABLET | Freq: Every day | ORAL | 1 refills | Status: DC

## 2021-12-13 ENCOUNTER — Other Ambulatory Visit: Payer: Self-pay | Admitting: Physician Assistant

## 2022-01-15 ENCOUNTER — Other Ambulatory Visit: Payer: Self-pay | Admitting: Physician Assistant

## 2022-02-20 ENCOUNTER — Encounter: Payer: Self-pay | Admitting: *Deleted

## 2022-03-20 ENCOUNTER — Other Ambulatory Visit: Payer: Self-pay | Admitting: Physician Assistant

## 2022-04-10 ENCOUNTER — Other Ambulatory Visit: Payer: Self-pay | Admitting: Physician Assistant

## 2022-04-10 DIAGNOSIS — F5102 Adjustment insomnia: Secondary | ICD-10-CM

## 2022-04-10 MED ORDER — LORAZEPAM 1 MG PO TABS: 1.0000 mg | ORAL_TABLET | Freq: Every day | ORAL | 0 refills | Status: DC

## 2022-04-10 NOTE — Telephone Encounter (Signed)
Pt requesting refill for Ativan 1 mg. Last OV 09/2021

## 2022-04-10 NOTE — Telephone Encounter (Signed)
Patient needs an appointment for further refills of this medication. This will be her last 30 day supply.

## 2022-04-10 NOTE — Telephone Encounter (Signed)
LAST APPOINTMENT DATE:  10/12/21  NEXT APPOINTMENT DATE: None  MEDICATION: LORazepam (ATIVAN) 1 MG tablet   Is the patient out of medication? Has 2 pills left  [facility]ACY: [facility] at [address], [place], [place] [address]  Patient states: - She doesn't have a rx plan. Cheaper to get 3 month supply.  - She knows she hasn't been seen in a little while but that's only because she is self pay right now - [facility] that she would only like her ativan sent to

## 2022-04-11 MED ORDER — LORAZEPAM 1 MG PO TABS: 1.0000 mg | ORAL_TABLET | Freq: Every day | ORAL | 0 refills | Status: DC

## 2022-04-11 NOTE — Addendum Note (Signed)
Addended by: Jimmye Norman on: 04/11/2022 09:13 AM   Modules accepted: Orders

## 2022-04-11 NOTE — Telephone Encounter (Signed)
Please resend Ativan, was sent to wrong pharmacy. Thanks

## 2022-04-11 NOTE — Telephone Encounter (Signed)
Patient states medication was sent to wrong pharmacy. Medication should have gone to [facility] at [facility]. Pt verbalized understanding about needing an OV for further refills.

## 2022-04-19 ENCOUNTER — Other Ambulatory Visit: Payer: Self-pay | Admitting: *Deleted

## 2022-04-19 MED ORDER — SERTRALINE HCL 50 MG PO TABS: 50.0000 mg | ORAL_TABLET | Freq: Every day | ORAL | 1 refills | Status: DC

## 2022-05-11 ENCOUNTER — Encounter: Payer: Self-pay | Admitting: *Deleted

## 2022-05-17 ENCOUNTER — Other Ambulatory Visit: Payer: Self-pay | Admitting: Physician Assistant

## 2022-06-07 ENCOUNTER — Telehealth: Payer: Self-pay | Admitting: Physician Assistant

## 2022-06-07 DIAGNOSIS — F5102 Adjustment insomnia: Secondary | ICD-10-CM

## 2022-06-07 MED ORDER — LORAZEPAM 1 MG PO TABS: 1.0000 mg | ORAL_TABLET | Freq: Every day | ORAL | 0 refills | Status: DC

## 2022-06-07 NOTE — Telephone Encounter (Signed)
Med was sent to the wrong pharmacy, [facility] to [facility]. Please call pt back with detail.

## 2022-06-07 NOTE — Telephone Encounter (Signed)
   LAST APPOINTMENT DATE:  10/12/21  NEXT APPOINTMENT DATE:  MEDICATION:  LORazepam (ATIVAN) 1 MG tablet   Is the patient out of medication? Will be out on 06/09/22  PHARMACY:  [id number] - [place], [place] Phone: [phone]  Fax: [phone]      Let patient know to contact pharmacy at the end of the day to make sure medication is ready.  Please notify patient to allow 48-72 hours to process

## 2022-06-07 NOTE — Telephone Encounter (Signed)
Pt requesting refill for Ativan 1 mg. Last OV 09/2021. Pt needs to schedule physical

## 2022-06-07 NOTE — Telephone Encounter (Signed)
Please resend Ativan to [facility], pharmacy changed.

## 2022-06-08 NOTE — Telephone Encounter (Signed)
Please check 10/12/21 OV - scheduled and charged as an OV but encounter notes state Annual Exam.   Please advise.

## 2022-06-08 NOTE — Telephone Encounter (Addendum)
Patient does not need to schedule Physical-Patient DOES need to schedule follow up for her Thyroid.  Medication request was sent to Pharmacy.

## 2022-06-08 NOTE — Telephone Encounter (Signed)
Office visit on 10/12/21 was an Annual Exam, sorry missed it. Pt's medication was sent to pharmacy yesterday. Pt needs to follow up for her thyroid.

## 2022-06-12 NOTE — Telephone Encounter (Signed)
LVM for Patient to schedule Follow Up for Thyroid

## 2022-06-14 ENCOUNTER — Other Ambulatory Visit: Payer: Self-pay | Admitting: Physician Assistant

## 2022-08-08 ENCOUNTER — Other Ambulatory Visit: Payer: Self-pay | Admitting: Physician Assistant

## 2022-08-08 DIAGNOSIS — F5102 Adjustment insomnia: Secondary | ICD-10-CM

## 2022-08-08 NOTE — Telephone Encounter (Signed)
  Encourage patient to contact the pharmacy for refills or they can request refills through [facility]:  Please schedule appointment if longer than 1 year  NEXT APPOINTMENT DATE:  MEDICATION:  LORazepam (ATIVAN) 1 MG tablet   Is the patient out of medication? ALMOST  PHARMACY:  [id number] - [place], [place] Phone: [phone]  Fax: [phone]      Let patient know to contact pharmacy at the end of the day to make sure medication is ready.  Please notify patient to allow 48-72 hours to process

## 2022-08-08 NOTE — Telephone Encounter (Signed)
Pt requested refill for Ativan 1 mg. Last OV 10/12/2021.

## 2022-08-10 ENCOUNTER — Other Ambulatory Visit: Payer: Self-pay | Admitting: Physician Assistant

## 2022-08-10 DIAGNOSIS — F5102 Adjustment insomnia: Secondary | ICD-10-CM

## 2022-08-10 NOTE — Telephone Encounter (Signed)
Patient called for update. Informed her she would need an OV but she stated she still doesn't have insurance. Requests refill since completely out.

## 2022-08-10 NOTE — Telephone Encounter (Signed)
Last refill: 06/07/22 #30, 0 Last OV: 10/12/21 dx. Hypothyroidism and anxiety  Next OV: 08/24/22, VV

## 2022-08-11 ENCOUNTER — Encounter (HOSPITAL_BASED_OUTPATIENT_CLINIC_OR_DEPARTMENT_OTHER): Payer: Self-pay | Admitting: Emergency Medicine

## 2022-08-11 ENCOUNTER — Other Ambulatory Visit: Payer: Self-pay

## 2022-08-11 ENCOUNTER — Emergency Department (HOSPITAL_BASED_OUTPATIENT_CLINIC_OR_DEPARTMENT_OTHER)
Admission: EM | Admit: 2022-08-11 | Discharge: 2022-08-11 | Disposition: A | Discharge status: Home / Self Care | Payer: BC Managed Care – PPO | Attending: Emergency Medicine | Admitting: Emergency Medicine

## 2022-08-11 DIAGNOSIS — R9431 Abnormal electrocardiogram [ECG] [EKG]: Secondary | ICD-10-CM | POA: Diagnosis not present

## 2022-08-11 DIAGNOSIS — R0981 Nasal congestion: Secondary | ICD-10-CM | POA: Diagnosis not present

## 2022-08-11 DIAGNOSIS — R059 Cough, unspecified: Secondary | ICD-10-CM | POA: Diagnosis not present

## 2022-08-11 DIAGNOSIS — R053 Chronic cough: Secondary | ICD-10-CM | POA: Diagnosis not present

## 2022-08-11 DIAGNOSIS — Z20822 Contact with and (suspected) exposure to covid-19: Secondary | ICD-10-CM | POA: Insufficient documentation

## 2022-08-11 LAB — RESP PANEL BY RT-PCR (RSV, FLU A&B, COVID)  RVPGX2
Influenza A by PCR: NEGATIVE
Influenza B by PCR: NEGATIVE
Resp Syncytial Virus by PCR: NEGATIVE
SARS Coronavirus 2 by RT PCR: NEGATIVE

## 2022-08-11 MED ORDER — DOXYCYCLINE HYCLATE 100 MG PO CAPS: 100.0000 mg | ORAL_CAPSULE | Freq: Two times a day (BID) | ORAL | 0 refills | Status: DC

## 2022-08-11 NOTE — Discharge Instructions (Addendum)
Try pepcid or tagamet up to twice a day.  Try to avoid things that may make this worse, most commonly these are spicy foods tomato based products fatty foods chocolate and peppermint.  Alcohol and tobacco can also make this worse.  Return to the emergency department for sudden worsening pain fever or inability to eat or drink. If your symptoms are worse at night then please try not to eat or drink anything including water 4 hours before you go to bed.  I have started you on an antibiotic to cover for possible atypical bacterial infection.  I would have you continue taking your Zyrtec.  Please take this every day.  Please follow-up with your family doctor.  If you are having ongoing symptoms then they need to consider if you would benefit from referral to a gastroenterologist or a pulmonologist.

## 2022-08-11 NOTE — ED Provider Notes (Signed)
[facility] Provider Note   CSN: [id number] Arrival date & time: 08/11/22  [id number]     History  Chief Complaint  Patient presents with   Cough    Jessica Juarez is a 52 y.o. female.  52 yo F with a chief complaints of a cough.  This been going on for about a year.  Seems to be worse at night.  Has had some nasal congestion.  Was thought to be allergies and she has tried Zyrtec off and on without significant improvement.  Has been to the emergency department recently and was treated for possible bronchospasm.  She does not feel like an inhaler has helped her all that much at home.  Feeling the cough got significantly worse last night.  Had a sore throat a couple weeks ago.  No fevers or sore throat now.  No known sick contacts.   Cough      Home Medications Prior to Admission medications   Medication Sig Start Date End Date Taking? Authorizing Provider  doxycycline (VIBRAMYCIN) 100 MG capsule Take 1 capsule (100 mg total) by mouth 2 (two) times daily. One po bid x 7 days 08/11/22  Yes Deno Etienne, DO  acyclovir (ZOVIRAX) 400 MG tablet Take TID x 3 days for flares prn 10/12/21   Inda Coke, PA  albuterol (VENTOLIN HFA) 108 (90 Base) MCG/ACT inhaler Inhale 1-2 puffs into the lungs every 6 (six) hours as needed for wheezing or shortness of breath. Patient not taking: Reported on 10/12/2021 06/25/21   Lacretia Leigh, MD  cyclobenzaprine (FLEXERIL) 10 MG tablet Take 1 tablet (10 mg total) by mouth at bedtime. 08/17/20   Inda Coke, PA  levothyroxine (SYNTHROID) 88 MCG tablet TAKE 1 TABLET BY MOUTH EVERY DAY 06/14/22   Inda Coke, PA  LORazepam (ATIVAN) 1 MG tablet Take 1 tablet (1 mg total) by mouth at bedtime. Consider cutting tablets in half to make it to next appointment. 08/10/22   Inda Coke, PA  predniSONE (STERAPRED UNI-PAK 21 TAB) 10 MG (21) TBPK tablet Take by mouth daily. Take 6 tabs by mouth daily  for 2 days, then 5 tabs  for 2 days, then 4 tabs for 2 days, then 3 tabs for 2 days, 2 tabs for 2 days, then 1 tab by mouth daily for 2 days Patient not taking: Reported on 10/12/2021 06/25/21   Lacretia Leigh, MD  sertraline (ZOLOFT) 50 MG tablet Take 1 tablet (50 mg total) by mouth daily. 04/19/22   Inda Coke, PA  Vitamin D, Ergocalciferol, 2000 units CAPS Take 3 capsules by mouth daily.     [provider]      Allergies    Erythromycin and Keflex [cephalexin]    Review of Systems   Review of Systems  Respiratory:  Positive for cough.     Physical Exam Updated Vital Signs BP 121/81 (BP Location: Right Arm)   Pulse 85   Temp 98.5 F (36.9 C) (Oral)   LMP 01/19/2020   SpO2 99%  Physical Exam Vitals and nursing note reviewed.  Constitutional:      General: She is not in acute distress.    Appearance: She is well-developed. She is not diaphoretic.  HENT:     Head: Normocephalic and atraumatic.  Eyes:     Pupils: Pupils are equal, round, and reactive to light.  Cardiovascular:     Rate and Rhythm: Normal rate and regular rhythm.     Heart sounds: No murmur heard.  No friction rub. No gallop.  Pulmonary:     Effort: Pulmonary effort is normal.     Breath sounds: No wheezing or rales.  Abdominal:     General: There is no distension.     Palpations: Abdomen is soft.     Tenderness: There is no abdominal tenderness.  Musculoskeletal:        General: No tenderness.     Cervical back: Normal range of motion and neck supple.  Skin:    General: Skin is warm and dry.  Neurological:     Mental Status: She is alert and oriented to person, place, and time.  Psychiatric:        Behavior: Behavior normal.     ED Results / Procedures / Treatments   Labs (all labs ordered are listed, but only abnormal results are displayed) Labs Reviewed  RESP PANEL BY RT-PCR (RSV, FLU A&B, COVID)  [id number]    EKG EKG Interpretation  Date/Time:  Friday August 11 2022 07:30:44 EDT Ventricular Rate:   87 PR Interval:  152 QRS Duration: 84 QT Interval:  340 QTC Calculation: 409 R Axis:   22 Text Interpretation: Sinus rhythm No significant change since last tracing Confirmed by Deno Etienne [phone]) on 08/11/2022 8:18:12 AM  Radiology No results found.  Procedures Procedures    Medications Ordered in ED Medications - No data to display  ED Course/ Medical Decision Making/ A&P                             Medical Decision Making Risk Prescription drug management.   52 yo F with a chief complaints of cough going on for 12 months.  She is well-appearing nontoxic.  Has clear lung sounds for me on exam.  Just had imaging recently I do not feel that she benefit from repeat imaging today.  She had a viral panel performed in triage.  Negative COVID flu and RSV.  EKG unchanged.  Will treat as possible reflux, will also treat as possible pertussis.  Encouraged her to follow-up with her family doctor and told her that she might need referral if her symptoms persist.  8:20 AM:  I have discussed the diagnosis/risks/treatment options with the patient.  Evaluation and diagnostic testing in the emergency department does not suggest an emergent condition requiring admission or immediate intervention beyond what has been performed at this time.  They will follow up with PCP. We also discussed returning to the ED immediately if new or worsening sx occur. We discussed the sx which are most concerning (e.g., sudden worsening pain, fever, inability to tolerate by mouth) that necessitate immediate return. Medications administered to the patient during their visit and any new prescriptions provided to the patient are listed below.  Medications given during this visit Medications - No data to display   The patient appears reasonably screen and/or stabilized for discharge and I doubt any other medical condition or other [facility] requiring further screening, evaluation, or treatment in the ED at this time prior to  discharge.          Final Clinical Impression(s) / ED Diagnoses Final diagnoses:  Chronic cough    Rx / DC Orders ED Discharge Orders          Ordered    doxycycline (VIBRAMYCIN) 100 MG capsule  2 times daily        08/11/22 0816  Deno Etienne, DO 08/11/22 [phone]

## 2022-08-11 NOTE — ED Triage Notes (Signed)
Pt reports cough that started 1 year ago. Pt reports that she was seen in January for the same. Pt reports fever last and chills last night.

## 2022-08-22 ENCOUNTER — Encounter: Payer: Self-pay | Admitting: Physician Assistant

## 2022-08-22 ENCOUNTER — Telehealth (INDEPENDENT_AMBULATORY_CARE_PROVIDER_SITE_OTHER): Payer: BC Managed Care – PPO | Admitting: Physician Assistant

## 2022-08-22 VITALS — Ht 60.0 in

## 2022-08-22 DIAGNOSIS — F5102 Adjustment insomnia: Secondary | ICD-10-CM | POA: Diagnosis not present

## 2022-08-22 DIAGNOSIS — R053 Chronic cough: Secondary | ICD-10-CM

## 2022-08-22 MED ORDER — LORAZEPAM 1 MG PO TABS: 1.0000 mg | ORAL_TABLET | Freq: Every day | ORAL | 2 refills | Status: DC

## 2022-08-22 NOTE — Progress Notes (Signed)
I acted as a Education administrator for [employer], PA-C Anselmo Pickler, LPN  Virtual Visit via Video Note   Jessica Dingwall, PA  connected with  Jessica Juarez  ([id number], July 02, 1970) on 08/22/22 at 11:20 AM EDT by a video-enabled telemedicine application and verified that I am speaking with the correct person using two identifiers.  Location: Patient: Home Provider: [place] office   I discussed the limitations of evaluation and management by telemedicine and the availability of in person appointments. The patient expressed understanding and agreed to proceed.    History of Present Illness: Jessica Juarez is a 52 y.o. who identifies as a female who was assigned female at birth, and is being seen today for anxiety and chronic cough.  Anxiety Pt is currently taking Lorazepam 1 mg at bedtime, needs refill.  Tolerating well Denies side effects from this at this time or falls Medication is working well for pt She is reluctant to make changes at this time  Chronic cough Patient reports chronic cough Has been to ER x 2 since this has started Had negative CXR Jan 2023 for this Was given oral doxycyline on 08/11/22 and felt like this helped a little bit Has not tried any reflux medications or antihistamines   Problems:  Patient Active Problem List   Diagnosis Date Noted   Vitamin D deficiency 08/07/2017   Constipation 08/07/2017   Anxiety 08/07/2017   Situational depression 08/07/2017   Herpes simplex labialis 08/07/2017   Hypothyroidism 08/07/2017    Allergies:  Allergies  Allergen Reactions   Erythromycin Palpitations   Keflex [Cephalexin] Hives   Medications:  Current Outpatient Medications:    acyclovir (ZOVIRAX) 400 MG tablet, Take TID x 3 days for flares prn, Disp: 30 tablet, Rfl: 1   cyclobenzaprine (FLEXERIL) 10 MG tablet, Take 1 tablet (10 mg total) by mouth at bedtime. (Patient taking differently: Take 10 mg by mouth as needed.), Disp: 30 tablet, Rfl: 1    levothyroxine (SYNTHROID) 88 MCG tablet, TAKE 1 TABLET BY MOUTH EVERY DAY, Disp: 90 tablet, Rfl: 1   sertraline (ZOLOFT) 50 MG tablet, Take 1 tablet (50 mg total) by mouth daily., Disp: 90 tablet, Rfl: 1   Vitamin D, Ergocalciferol, 2000 units CAPS, Take 3 capsules by mouth daily. , Disp: , Rfl:    LORazepam (ATIVAN) 1 MG tablet, Take 1 tablet (1 mg total) by mouth at bedtime., Disp: 30 tablet, Rfl: 2  Observations/Objective: Patient is well-developed, well-nourished in no acute distress.  Resting comfortably  at home.  Head is normocephalic, atraumatic.  No labored breathing.  Speech is clear and coherent with logical content.  Patient is alert and oriented at baseline.    Assessment and Plan: 1. Adjustment insomnia Well controlled Reviewed PDMP -- no red flags Continue Ativan 1 mg nightly  Follow-up in 3-6 months, sooner if concerns  2. Chronic cough Uncontrolled Recommend prilosec or nexium to help with symptoms If lack of improvement will refer to allergy vs pulm Follow-up in office if persists  Follow Up Instructions: I discussed the assessment and treatment plan with the patient. The patient was provided an opportunity to ask questions and all were answered. The patient agreed with the plan and demonstrated an understanding of the instructions.  A copy of instructions were sent to the patient via MyChart unless otherwise noted below.   The patient was advised to call back or seek an in-person evaluation if the symptoms worsen or if the condition fails to improve as anticipated.  Inda Coke, [place]

## 2022-08-24 ENCOUNTER — Telehealth: Payer: Self-pay | Admitting: Physician Assistant

## 2022-10-03 ENCOUNTER — Other Ambulatory Visit: Payer: Self-pay | Admitting: Physician Assistant

## 2022-10-27 IMAGING — DX DG CHEST 2V
2 series · 2 of 2 positions shown · non-contrast
Comparison: 03/05/2020

CLINICAL DATA: Chest pain, cough

EXAM:
CHEST - 2 VIEW

[chest pa]
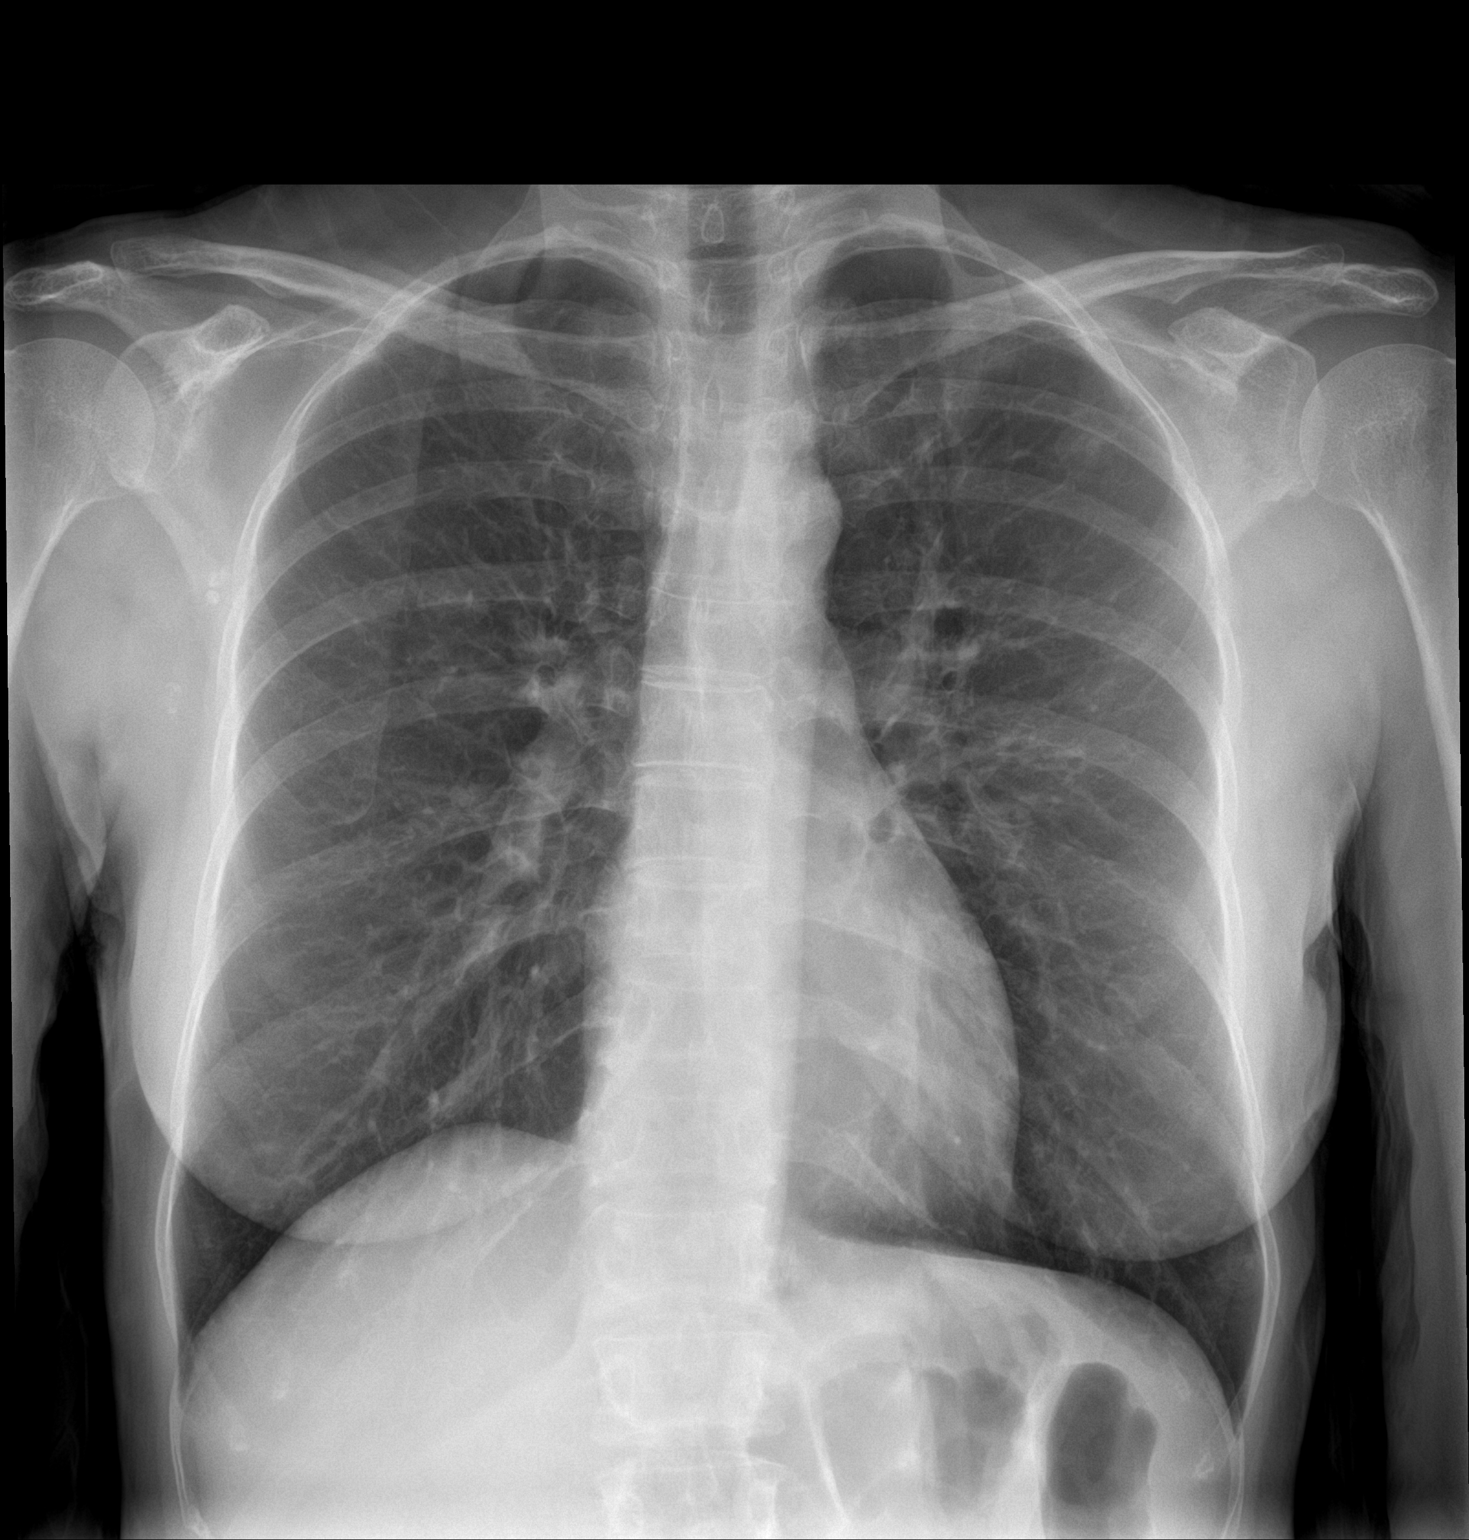

[chest lat]
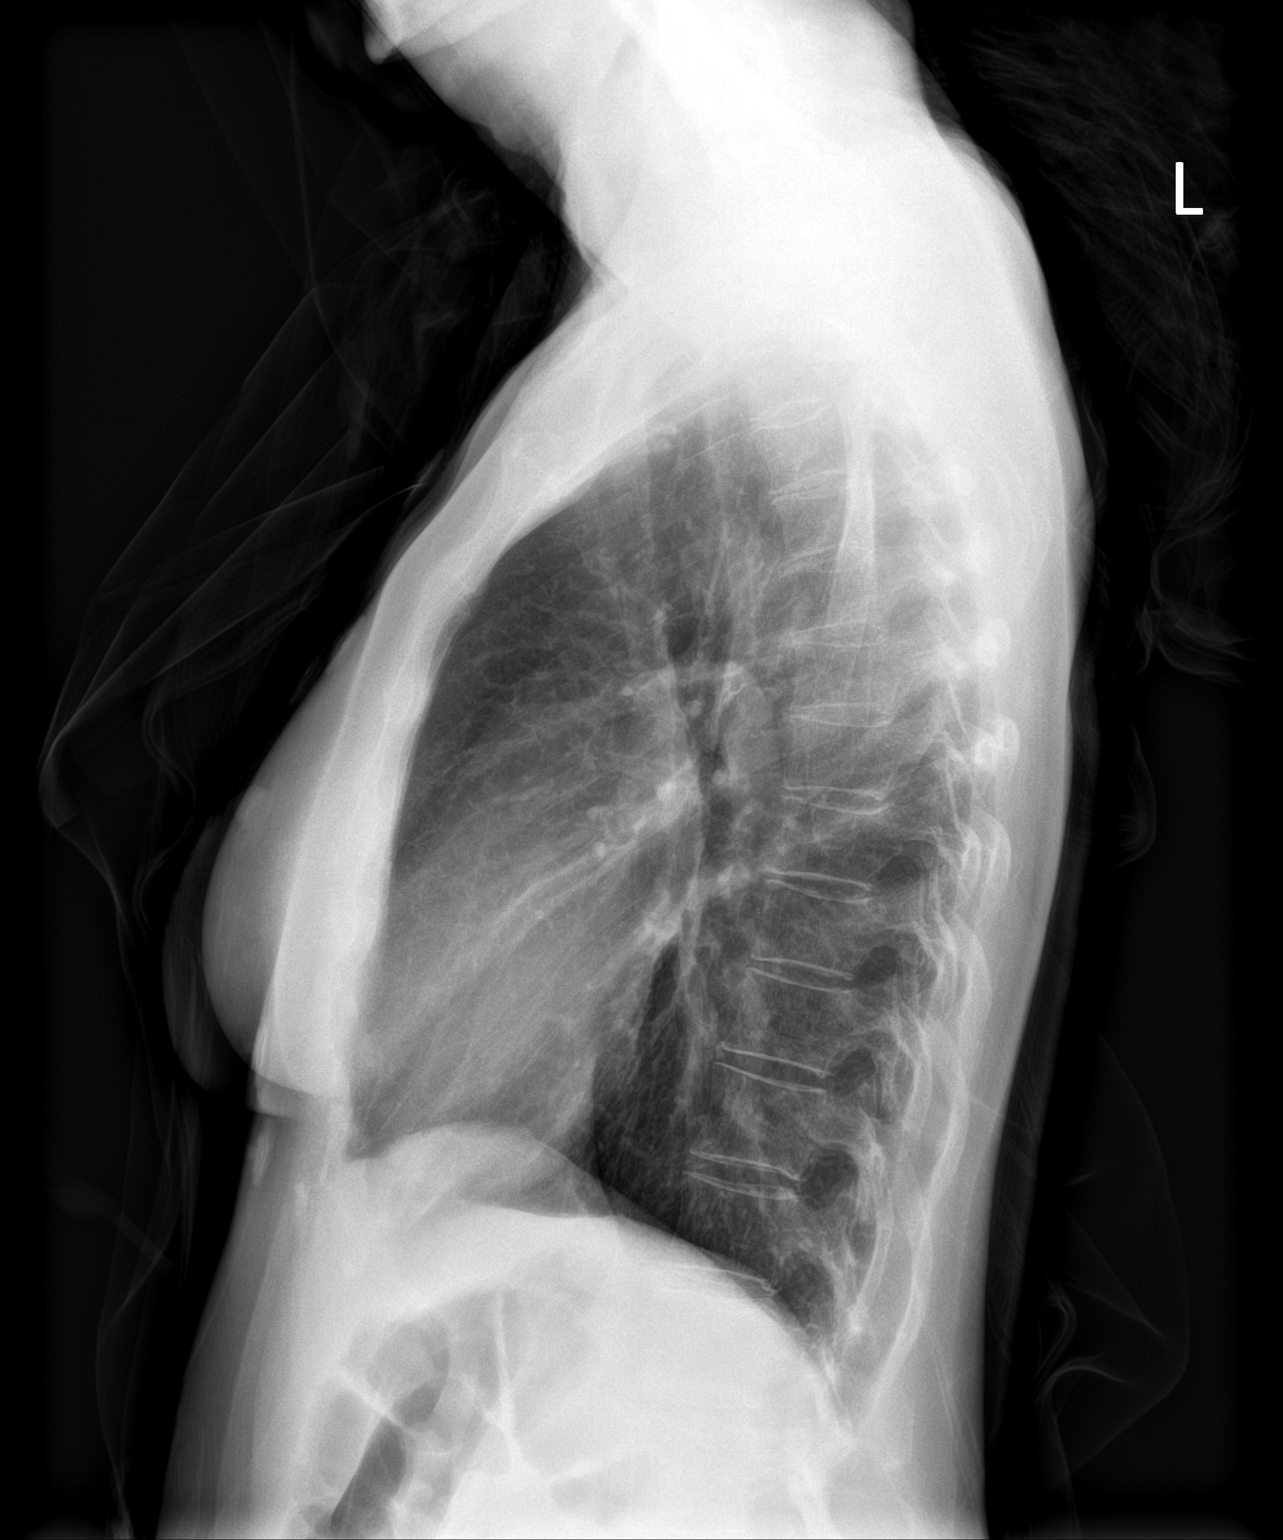

[2 of 2 positions shown; findings below may reference images not displayed]

FINDINGS: The heart size and mediastinal contours are within normal limits.
Both lungs are clear. The visualized skeletal structures are
unremarkable.
IMPRESSION: No active cardiopulmonary disease.

## 2022-11-09 ENCOUNTER — Other Ambulatory Visit: Payer: Self-pay | Admitting: Physician Assistant

## 2022-11-20 NOTE — Progress Notes (Signed)
Jessica Juarez is a 52 y.o. female here for a medication encounter.  History of Present Illness:   No chief complaint on file.   HPI  Requesting refill    Past Medical History:  Diagnosis Date   Anxiety    Heart murmur    Thyroid disease      Social History   Tobacco Use   Smoking status: Never   Smokeless tobacco: Never  Vaping Use   Vaping Use: Never used  Substance Use Topics   Alcohol use: Yes   Drug use: No    Past Surgical History:  Procedure Laterality Date   CESAREAN SECTION     EYE SURGERY     TUBAL LIGATION     WISDOM TOOTH EXTRACTION      Family History  Problem Relation Age of Onset   Diabetes Mother    Depression Mother    Colon cancer Father        74   Liver cancer Father    Thyroid disease Brother    Migraines Son    Diabetes Maternal Grandmother    Colon cancer Paternal Grandfather     Allergies  Allergen Reactions   Erythromycin Palpitations   Keflex [Cephalexin] Hives    Current Medications:   Current Outpatient Medications:    acyclovir (ZOVIRAX) 400 MG tablet, TAKE 1 TABLET BY MOUTH 3 TIMES DAILY FOR 3 DAYS FOR FLARES AS NEEDED, Disp: 30 tablet, Rfl: 1   cyclobenzaprine (FLEXERIL) 10 MG tablet, Take 1 tablet (10 mg total) by mouth at bedtime. (Patient taking differently: Take 10 mg by mouth as needed.), Disp: 30 tablet, Rfl: 1   levothyroxine (SYNTHROID) 88 MCG tablet, TAKE 1 TABLET BY MOUTH EVERY DAY, Disp: 90 tablet, Rfl: 1   LORazepam (ATIVAN) 1 MG tablet, Take 1 tablet (1 mg total) by mouth at bedtime., Disp: 30 tablet, Rfl: 2   sertraline (ZOLOFT) 50 MG tablet, Take 1 tablet (50 mg total) by mouth daily., Disp: 90 tablet, Rfl: 1   Vitamin D, Ergocalciferol, 2000 units CAPS, Take 3 capsules by mouth daily. , Disp: , Rfl:    Review of Systems:   ROS  Vitals:   There were no vitals filed for this visit.   There is no height or weight on file to calculate BMI.  Physical Exam:   Physical Exam  Assessment and Plan:    ***   I,Alexander Ruley,acting as a scribe for Jarold Motto, PA.,have documented all relevant documentation on the behalf of Jarold Motto, PA,as directed by  Jarold Motto, PA while in the presence of Jarold Motto, [place].   ***   Jarold Motto, PA-C

## 2022-11-22 ENCOUNTER — Ambulatory Visit (INDEPENDENT_AMBULATORY_CARE_PROVIDER_SITE_OTHER): Payer: BC Managed Care – PPO | Admitting: Physician Assistant

## 2022-11-22 ENCOUNTER — Encounter: Payer: Self-pay | Admitting: Physician Assistant

## 2022-11-22 VITALS — BP 110/80 | HR 82 | Temp 98.0°F | Ht 60.0 in | Wt 108.4 lb

## 2022-11-22 DIAGNOSIS — F419 Anxiety disorder, unspecified: Secondary | ICD-10-CM | POA: Diagnosis not present

## 2022-11-22 DIAGNOSIS — S62608G Fracture of unspecified phalanx of other finger, subsequent encounter for fracture with delayed healing: Secondary | ICD-10-CM

## 2022-11-22 DIAGNOSIS — Z13220 Encounter for screening for lipoid disorders: Secondary | ICD-10-CM

## 2022-11-22 DIAGNOSIS — F5102 Adjustment insomnia: Secondary | ICD-10-CM | POA: Diagnosis not present

## 2022-11-22 DIAGNOSIS — E039 Hypothyroidism, unspecified: Secondary | ICD-10-CM | POA: Diagnosis not present

## 2022-11-22 DIAGNOSIS — Z136 Encounter for screening for cardiovascular disorders: Secondary | ICD-10-CM

## 2022-11-22 MED ORDER — SERTRALINE HCL 50 MG PO TABS: 50.0000 mg | ORAL_TABLET | Freq: Every day | ORAL | 1 refills | Status: DC

## 2022-11-22 MED ORDER — LORAZEPAM 1 MG PO TABS: 1.0000 mg | ORAL_TABLET | Freq: Every day | ORAL | 1 refills | Status: DC

## 2022-11-22 NOTE — Patient Instructions (Addendum)
It was great to see you!  Please schedule a lab only appointment at your convenience -- you do NOT need to be fasting  Follow-up for a physical and to discuss medication weaning when you are ready  Good luck with your hand!    [facility] if needed in future!   Take care,  Jarold Motto PA-C

## 2022-11-23 ENCOUNTER — Other Ambulatory Visit: Payer: BC Managed Care – PPO

## 2022-11-27 ENCOUNTER — Other Ambulatory Visit: Payer: Self-pay | Admitting: Physician Assistant

## 2022-12-11 ENCOUNTER — Other Ambulatory Visit: Payer: Self-pay | Admitting: Physician Assistant

## 2023-01-17 ENCOUNTER — Other Ambulatory Visit: Payer: Self-pay | Admitting: Physician Assistant

## 2023-02-14 ENCOUNTER — Other Ambulatory Visit: Payer: Self-pay | Admitting: Physician Assistant

## 2023-02-28 ENCOUNTER — Other Ambulatory Visit: Payer: Self-pay | Admitting: Physician Assistant

## 2023-03-02 ENCOUNTER — Telehealth: Payer: Self-pay | Admitting: Physician Assistant

## 2023-03-02 NOTE — Telephone Encounter (Signed)
Pt had an accident at work and was fired. She has no insurance and can't make appt for physical but can come in for blood work. She needs a refill on levothyroxine (SYNTHROID) 88 MCG tablet  - She states if we can send in just a few until she can come in for some blood work. Please advise.

## 2023-03-05 MED ORDER — LEVOTHYROXINE SODIUM 88 MCG PO TABS: 88.0000 ug | ORAL_TABLET | Freq: Every day | ORAL | 0 refills | Status: DC

## 2023-03-05 NOTE — Telephone Encounter (Signed)
Rx for Levothyroxine 88 mcg [facility].

## 2023-03-28 ENCOUNTER — Other Ambulatory Visit: Payer: Self-pay | Admitting: Physician Assistant

## 2023-05-08 ENCOUNTER — Other Ambulatory Visit: Payer: Self-pay | Admitting: Physician Assistant

## 2023-05-08 ENCOUNTER — Telehealth: Payer: Self-pay | Admitting: Physician Assistant

## 2023-05-08 DIAGNOSIS — F5102 Adjustment insomnia: Secondary | ICD-10-CM

## 2023-05-08 MED ORDER — LORAZEPAM 1 MG PO TABS: 1.0000 mg | ORAL_TABLET | Freq: Every day | ORAL | 0 refills | Status: DC

## 2023-05-08 NOTE — Telephone Encounter (Signed)
Prescription Request  05/08/2023  LOV: 11/22/2022  What is the name of the medication or equipment? LORazepam (ATIVAN) 1 MG tablet   Have you contacted your pharmacy to request a refill? Yes   Which pharmacy would you like this sent to?    [facility] [id number] [place],  - [address] [place] [address] Phone: [phone] Fax: [phone]    Patient notified that their request is being sent to the clinical staff for review and that they should receive a response within 2 business days.   Please advise at Mobile [phone] (mobile)

## 2023-05-08 NOTE — Telephone Encounter (Signed)
Left message on voicemail to call office.  

## 2023-05-08 NOTE — Telephone Encounter (Signed)
Pt requesting refill for Lorazepam 1 mg tablet. Last OV 10/2022.

## 2023-05-09 NOTE — Telephone Encounter (Signed)
Noted  

## 2023-05-09 NOTE — Telephone Encounter (Signed)
Patient is scheduled for OV 06/05/23

## 2023-05-29 ENCOUNTER — Other Ambulatory Visit: Payer: Self-pay | Admitting: Physician Assistant

## 2023-06-05 ENCOUNTER — Ambulatory Visit: Payer: BC Managed Care – PPO | Admitting: Physician Assistant

## 2023-06-12 ENCOUNTER — Encounter: Payer: Self-pay | Admitting: Physician Assistant

## 2023-06-12 ENCOUNTER — Telehealth (INDEPENDENT_AMBULATORY_CARE_PROVIDER_SITE_OTHER): Payer: Self-pay | Admitting: Physician Assistant

## 2023-06-12 VITALS — Ht 60.0 in | Wt 112.0 lb

## 2023-06-12 DIAGNOSIS — F5102 Adjustment insomnia: Secondary | ICD-10-CM

## 2023-06-12 DIAGNOSIS — F419 Anxiety disorder, unspecified: Secondary | ICD-10-CM

## 2023-06-12 DIAGNOSIS — E039 Hypothyroidism, unspecified: Secondary | ICD-10-CM

## 2023-06-12 DIAGNOSIS — F4321 Adjustment disorder with depressed mood: Secondary | ICD-10-CM

## 2023-06-12 DIAGNOSIS — R051 Acute cough: Secondary | ICD-10-CM

## 2023-06-12 MED ORDER — LEVOTHYROXINE SODIUM 88 MCG PO TABS: 88.0000 ug | ORAL_TABLET | Freq: Every day | ORAL | 1 refills | Status: DC

## 2023-06-12 MED ORDER — PROMETHAZINE-DM 6.25-15 MG/5ML PO SYRP: 5.0000 mL | ORAL_SOLUTION | Freq: Every evening | ORAL | 1 refills | Status: AC | PRN

## 2023-06-12 MED ORDER — LORAZEPAM 1 MG PO TABS: 1.0000 mg | ORAL_TABLET | Freq: Every day | ORAL | 5 refills | Status: DC

## 2023-06-12 MED ORDER — BENZONATATE 100 MG PO CAPS: 100.0000 mg | ORAL_CAPSULE | Freq: Three times a day (TID) | ORAL | 1 refills | Status: AC | PRN

## 2023-06-12 MED ORDER — SERTRALINE HCL 50 MG PO TABS: 50.0000 mg | ORAL_TABLET | Freq: Every day | ORAL | 1 refills | Status: DC

## 2023-06-12 NOTE — Progress Notes (Signed)
 I acted as a neurosurgeon for [employer], PA-C Arland Chute, LPN  Virtual Visit via Video Note   I, [name], PA, connected with  Jessica Juarez  ([id number], 1970-12-05) on 06/12/23 at  2:40 PM EST by a video-enabled telemedicine application and verified that I am speaking with the correct person using two identifiers.  Location: Patient: Home Provider:  [place] office   I discussed the limitations of evaluation and management by telemedicine and the availability of in person appointments. The patient expressed understanding and agreed to proceed.    History of Present Illness: Jessica Juarez is a 53 y.o. who identifies as a female who was assigned female at birth, and is being seen today for medication refill.   Hypothyroidism She is currently taking levothyroxine  88 mcg daily She has been lost to follow-up to recheck TSH She is struggling financially, recently was let go from her job and currently has temp work, no insurance  Insomnia Requires daily ativan  1 mg Denies any concerns with this medication, denies any increased falls or driving while on medication  Depression and Anxiety She is currently taking Zoloft  50 mg daily Denies suicidal ideation/hi  She needs refill on this today Does endorse situational stress with trying to find consistent work  Cough c/o cough since December off and on, worse at night, expectorating clear sputum denies fever or chills.  Pt would like some cough syrup and Tessalon  Pearles prescribed. Denies shortness of breath   Problems:  Patient Active Problem List   Diagnosis Date Noted   Vitamin D  deficiency 08/07/2017   Constipation 08/07/2017   Anxiety 08/07/2017   Situational depression 08/07/2017   Herpes simplex labialis 08/07/2017   Hypothyroidism 08/07/2017    Allergies:  Allergies  Allergen Reactions   Erythromycin Palpitations   Keflex [Cephalexin] Hives   Medications:  Current Outpatient Medications:     acyclovir  (ZOVIRAX ) 400 MG tablet, TAKE 1 TABLET BY MOUTH 3 TIMES DAILY FOR 3 DAYS FOR FLARES AS NEEDED, Disp: 270 tablet, Rfl: 0   benzonatate  (TESSALON  PERLES) 100 MG capsule, Take 1 capsule (100 mg total) by mouth 3 (three) times daily as needed., Disp: 30 capsule, Rfl: 1   promethazine -dextromethorphan (PROMETHAZINE -DM) 6.25-15 MG/5ML syrup, Take 5 mLs by mouth at bedtime as needed for cough., Disp: 120 mL, Rfl: 1   Vitamin D , Ergocalciferol , 2000 units CAPS, Take 4 capsules by mouth daily., Disp: , Rfl:    levothyroxine  (SYNTHROID ) 88 MCG tablet, Take 1 tablet (88 mcg total) by mouth daily., Disp: 90 tablet, Rfl: 1   LORazepam  (ATIVAN ) 1 MG tablet, Take 1 tablet (1 mg total) by mouth at bedtime., Disp: 30 tablet, Rfl: 5   sertraline  (ZOLOFT ) 50 MG tablet, Take 1 tablet (50 mg total) by mouth daily., Disp: 90 tablet, Rfl: 1  Observations/Objective: Patient is well-developed, well-nourished in no acute distress.  Resting comfortably  at home.  Head is normocephalic, atraumatic.  No labored breathing.  Speech is clear and coherent with logical content.  Patient is alert and oriented at baseline.   Assessment and Plan: Adjustment insomnia - LORazepam  (ATIVAN ) 1 MG tablet; Take 1 tablet (1 mg total) by mouth at bedtime.  Dispense: 30 tablet; Refill: 5  She is not ready to wean or stop this medication She is aware of long-term risks/benefits Prescription drug monitoring program reviewed without red flags Follow-up in 6 month(s), sooner if concerns  Anxiety (Primary); Situational depression Overall controlled Continue Zoloft  50 mg daily Follow-up in 6 month(s),  sooner if concerns Denies suicidal ideation/hi I discussed with patient that if they develop any SI, to tell someone immediately and seek medical attention.  Hypothyroidism, unspecified type Recommend updating TSH - she feels as though she cannot afford this -- orders have been placed should she be able to get this  checked Will refill 88 mcg levothyroxine  in the interim She denies any overt symptom(s) of over- or under-treated thyroid  presently -- discussed need to regularly check TSH and adjust medication for optimal thyroid  control  Acute cough No red flags on discussion Will initiate as needed tessalon  Perles and promethazine  cough syrup per orders.  Discussed taking medications as prescribed.  Reviewed return precautions including new or worsening fever, SOB, new or worsening cough or other concerns.  Push fluids and rest.  I recommend that patient follow-up if symptoms worsen or persist despite treatment x 7-10 days, sooner if needed.  Will need IN OFFICE visit to further evaluate   Follow Up Instructions: I discussed the assessment and treatment plan with the patient. The patient was provided an opportunity to ask questions and all were answered. The patient agreed with the plan and demonstrated an understanding of the instructions.  A copy of instructions were sent to the patient via MyChart unless otherwise noted below.   The patient was advised to call back or seek an in-person evaluation if the symptoms worsen or if the condition fails to improve as anticipated.  [name], [place]

## 2023-06-20 ENCOUNTER — Other Ambulatory Visit: Payer: Self-pay | Admitting: Physician Assistant

## 2023-11-12 ENCOUNTER — Telehealth: Payer: Self-pay | Admitting: *Deleted

## 2023-11-12 DIAGNOSIS — E039 Hypothyroidism, unspecified: Secondary | ICD-10-CM

## 2023-11-12 NOTE — Telephone Encounter (Signed)
 Okay to order TSH for patient?

## 2023-11-12 NOTE — Telephone Encounter (Signed)
 Please call pt and schedule lab appointment orders are in Epic.

## 2023-11-12 NOTE — Telephone Encounter (Signed)
 Copied from CRM [id number]. Topic: Clinical - Request for Lab/Test Order >> Nov 12, 2023  9:21 AM Kevelyn M wrote: Reason for CRM: Patient has no insurance and has a an arrangement with Dr. Roark Chick to check her thyroid  levels only before she has an appointment. Please let patient know when orders are ready. [phone]

## 2023-11-13 ENCOUNTER — Other Ambulatory Visit: Payer: Self-pay

## 2023-11-14 ENCOUNTER — Other Ambulatory Visit: Payer: Self-pay

## 2023-11-14 DIAGNOSIS — E039 Hypothyroidism, unspecified: Secondary | ICD-10-CM

## 2023-11-15 ENCOUNTER — Ambulatory Visit: Payer: Self-pay | Admitting: Physician Assistant

## 2023-11-15 DIAGNOSIS — E039 Hypothyroidism, unspecified: Secondary | ICD-10-CM

## 2023-11-15 LAB — TSH: TSH: 0.02 m[IU]/L — ABNORMAL LOW

## 2023-11-15 MED ORDER — LEVOTHYROXINE SODIUM 75 MCG PO TABS
75.0000 ug | ORAL_TABLET | Freq: Every day | ORAL | 1 refills | Status: DC
Start: 2023-11-15 — End: 2024-01-08

## 2023-11-16 ENCOUNTER — Encounter: Payer: Self-pay | Admitting: Physician Assistant

## 2023-11-16 ENCOUNTER — Telehealth (INDEPENDENT_AMBULATORY_CARE_PROVIDER_SITE_OTHER): Payer: Self-pay | Admitting: Physician Assistant

## 2023-11-16 DIAGNOSIS — F4321 Adjustment disorder with depressed mood: Secondary | ICD-10-CM

## 2023-11-16 DIAGNOSIS — E039 Hypothyroidism, unspecified: Secondary | ICD-10-CM

## 2023-11-16 DIAGNOSIS — F5102 Adjustment insomnia: Secondary | ICD-10-CM

## 2023-11-16 DIAGNOSIS — F419 Anxiety disorder, unspecified: Secondary | ICD-10-CM

## 2023-11-16 MED ORDER — LORAZEPAM 1 MG PO TABS: 1.0000 mg | ORAL_TABLET | Freq: Every day | ORAL | 5 refills | Status: DC

## 2023-11-16 NOTE — Progress Notes (Signed)
 Virtual Visit via Video Note   I, Alexander Iba, PA, connected with  Jessica Juarez  ([id number], [birth year]) on 11/16/23 at 11:00 AM EDT by a video-enabled telemedicine application and verified that I am speaking with the correct person using two identifiers.  Location: Patient: Home Provider: [place] office   I discussed the limitations of evaluation and management by telemedicine and the availability of in person appointments. The patient expressed understanding and agreed to proceed.    History of Present Illness: Jessica Juarez is a 53 y.o. who identifies as a female who was assigned female at birth, and is being seen today for hypothyroidism and anxiety.   Hypothyroidism Patient is currently taking 88 mcg of levothyroxine  daily  She did come to the lab a few days ago and had her TSH rechecked Lab Results  Component Value Date   TSH 0.02 (L) 11/14/2023   She does report that she has had some unintentional weight loss recently and feels like her thyroid  is off  Anxiety and depression She is currently taking Zoloft  50 mg daily She is also taking Ativan  1 mg daily for anxiety and insomnia Denies suicidal or homicidal ideation  Problems:  Patient Active Problem List   Diagnosis Date Noted   Vitamin D  deficiency 08/07/2017   Constipation 08/07/2017   Anxiety 08/07/2017   Situational depression 08/07/2017   Herpes simplex labialis 08/07/2017   Hypothyroidism 08/07/2017    Allergies:  Allergies  Allergen Reactions   Erythromycin Palpitations   Keflex [Cephalexin] Hives   Medications:  Current Outpatient Medications:    acyclovir  (ZOVIRAX ) 400 MG tablet, TAKE 1 TABLET BY MOUTH 3 TIMES DAILY FOR 3 DAYS FOR FLARES AS NEEDED, Disp: 270 tablet, Rfl: 0   benzonatate  (TESSALON  PERLES) 100 MG capsule, Take 1 capsule (100 mg total) by mouth 3 (three) times daily as needed., Disp: 30 capsule, Rfl: 1   levothyroxine  (SYNTHROID ) 75 MCG tablet, Take 1 tablet (75 mcg  total) by mouth daily., Disp: 30 tablet, Rfl: 1   LORazepam  (ATIVAN ) 1 MG tablet, Take 1 tablet (1 mg total) by mouth at bedtime., Disp: 30 tablet, Rfl: 5   promethazine -dextromethorphan (PROMETHAZINE -DM) 6.25-15 MG/5ML syrup, Take 5 mLs by mouth at bedtime as needed for cough., Disp: 120 mL, Rfl: 1   sertraline  (ZOLOFT ) 50 MG tablet, Take 1 tablet (50 mg total) by mouth daily., Disp: 90 tablet, Rfl: 1   Vitamin D , Ergocalciferol , 2000 units CAPS, Take 4 capsules by mouth daily., Disp: , Rfl:   Observations/Objective: Patient is well-developed, well-nourished in no acute distress.  Resting comfortably  at home.  Head is normocephalic, atraumatic.  No labored breathing.  Speech is clear and coherent with logical content.  Patient is alert and oriented at baseline.   Assessment and Plan: Hypothyroidism, unspecified type (Primary) Uncontrolled Decreased her levothyroxine  to 75 mcg daily today Future blood work orders have been put in for her to recheck after 4 to 6 weeks If symptoms persist after dose adjustment, recommend office visit to review symptoms further and explore additional possible causes  Anxiety/Adjustment insomnia Currently well-controlled with 1 mg lorazepam  daily She is aware of long-term risks of this medication and wants to continue this medication No red flags on PDMP Follow-up in 36 months, sooner if concerns  Situational depression Currently well-controlled with 50 mg Zoloft  daily Denies suicidal or homicidal ideation  Follow Up Instructions: I discussed the assessment and treatment plan with the patient. The patient was provided an opportunity to ask  questions and all were answered. The patient agreed with the plan and demonstrated an understanding of the instructions.  A copy of instructions were sent to the patient via MyChart unless otherwise noted below.   The patient was advised to call back or seek an in-person evaluation if the symptoms worsen or if the  condition fails to improve as anticipated.  Alexander Iba, [place]

## 2023-12-07 ENCOUNTER — Telehealth: Payer: Self-pay | Admitting: Physician Assistant

## 2023-12-07 NOTE — Telephone Encounter (Unsigned)
 Copied from CRM [id number]. Topic: Clinical - Medication Refill >> Dec 07, 2023  9:01 AM Suzen RAMAN wrote: Medication:LORazepam  (ATIVAN ) 1 MG tablet(pharmacy would like someone to contact them to provide verbal approval for patients medication to be refill early)  Has the patient contacted their pharmacy? Yes  This is the patient's preferred pharmacy:  [id number] [place], [place] - [address] [place] [address] Phone: [phone] Fax: [phone]  Is this the correct pharmacy for this prescription? Yes If no, delete pharmacy and type the correct one.   Has the prescription been filled recently? No  Is the patient out of the medication? No- Patient will be as she is planning to go out of town and will be out of medication on Sunday.  Has the patient been seen for an appointment in the last year OR does the patient have an upcoming appointment? Yes  Can we respond through MyChart? Yes  Agent: Please be advised that Rx refills may take up to 3 business days. We ask that you follow-up with your pharmacy.

## 2023-12-07 NOTE — Telephone Encounter (Signed)
 Please see pt call msg requesting early refill and advise

## 2023-12-07 NOTE — Telephone Encounter (Signed)
 Called pharmacy per PCP approval and advised verbal ok with PCP for early refill for patient.

## 2024-01-08 ENCOUNTER — Other Ambulatory Visit: Payer: Self-pay | Admitting: Physician Assistant

## 2024-01-16 ENCOUNTER — Other Ambulatory Visit: Payer: Self-pay | Admitting: Physician Assistant

## 2024-03-05 ENCOUNTER — Encounter: Payer: Self-pay | Admitting: Physician Assistant

## 2024-03-05 ENCOUNTER — Ambulatory Visit: Payer: Self-pay

## 2024-03-05 NOTE — Telephone Encounter (Signed)
 FYI Only or Action Required?: Action required by provider: request for documentation or forms.  Patient was last seen in primary care on 11/16/2023 by Job Lukes, PA.  Called Nurse Triage reporting Knee Injury.  Symptoms began several days ago.  Interventions attempted: OTC medications: Ibuprofen , Rest, hydration, or home remedies, and Ice/heat application.  Symptoms are: gradually improving.  Triage Disposition: Home Care  Patient/caregiver understands and will follow disposition?: Yes Reason for Disposition  Minor injury or pain from direct blow  Answer Assessment - Initial Assessment Questions Keeping knee elevated, icing and taking Ibuprofen . Patient has been out of work and work stated patient needed a return to work note cleared to return on Monday. Patient stated she has no insurance, son was just there last week and it is costly. Patient stated PCP knows her situation, and is requesting that she write her a return to work note and would greatly appreciate it. Please advise.    1. MECHANISM: How did the injury happen? (e.g., twisting injury, direct blow)      Fell at [employer]   2. ONSET: When did the injury happen? (e.g., minutes, hours ago)      Sunday  3. LOCATION: Where is the injury located?      Right knee   4. APPEARANCE of INJURY: What does the injury look like?      Swelling went down, slightly black and blue  5. SEVERITY: Can you put weight on that leg? Can you walk?      Hurts slightly when putting pressure on it  6. PAIN: Is there pain? If Yes, ask: How bad is the pain?   What does it keep you from doing? (Scale 0-10; or none, mild, moderate, severe)     Mild to moderate  7. OTHER SYMPTOMS: Do you have any other symptoms?  (e.g., pop when knee injured, swelling, locking, buckling)      Denies  Protocols used: Knee Injury-A-AH  Copied from CRM #[id number]. Topic: Clinical - Red Word Triage >> Mar 05, 2024  1:48 PM Drema MATSU  wrote: [facility] that prompted transfer to Nurse Triage: Patient fell at a restaurant on Sunday. She stated that she bruised her knee and has been icing it, taking ibuprofen , and keeping it elevated. She said that it was swollen but is not anymore but still hurts when pressed. She is also needing a return to work note from doctor.  *requesting a phone appt if possible due to not having insurance   ----------------------------------------------------------------------- From previous Reason for Contact - Scheduling: Patient/patient representative is calling to schedule an appointment. Refer to attachments for appointment information.

## 2024-03-05 NOTE — Telephone Encounter (Signed)
 Please call patient and schedule a Virtual visit with another provider since Jessica Juarez is out of the office till Monday. We can not give work not with out being seen.

## 2024-03-10 ENCOUNTER — Telehealth (INDEPENDENT_AMBULATORY_CARE_PROVIDER_SITE_OTHER): Payer: Self-pay | Admitting: Physician Assistant

## 2024-03-10 ENCOUNTER — Encounter: Payer: Self-pay | Admitting: Physician Assistant

## 2024-03-10 DIAGNOSIS — M25561 Pain in right knee: Secondary | ICD-10-CM

## 2024-03-10 NOTE — Progress Notes (Signed)
 Virtual Visit via Video Note   I, [name], connected with  Jessica Juarez  ([id number], 02/22/1971) on 03/10/24 at  1:20 PM EDT by a video-enabled telemedicine application and verified that I am speaking with the correct person using two identifiers.  Location: Patient: Home Provider: [place] office   I discussed the limitations of evaluation and management by telemedicine and the availability of in person appointments. The patient expressed understanding and agreed to proceed.    History of Present Illness: Jessica Juarez is a 53 y.o. who identifies as a female who was assigned female at birth, and is being seen today for R knee pain  Discussed the use of AI scribe software for clinical note transcription with the patient, who gave verbal consent to proceed.  She tripped and fell on her right knee at a restaurant last Sunday. Initial swelling was present on Monday and Tuesday, improving by Wednesday. The knee is no longer swollen but remains slightly sore to the touch. She applied ice immediately after the injury and kept the knee elevated. She is able to bear weight on the knee. She has been out of work since the incident and requires a note to return.   Problems:  Patient Active Problem List   Diagnosis Date Noted   Vitamin D  deficiency 08/07/2017   Constipation 08/07/2017   Anxiety 08/07/2017   Situational depression 08/07/2017   Herpes simplex labialis 08/07/2017   Hypothyroidism 08/07/2017    Allergies:  Allergies  Allergen Reactions   Erythromycin Palpitations   Keflex [Cephalexin] Hives   Medications:  Current Outpatient Medications:    acyclovir  (ZOVIRAX ) 400 MG tablet, TAKE 1 TABLET BY MOUTH 3 TIMES DAILY FOR 3 DAYS FOR FLARES AS NEEDED, Disp: 270 tablet, Rfl: 0   benzonatate  (TESSALON  PERLES) 100 MG capsule, Take 1 capsule (100 mg total) by mouth 3 (three) times daily as needed., Disp: 30 capsule, Rfl: 1   LORazepam  (ATIVAN ) 1 MG tablet, Take 1  tablet (1 mg total) by mouth at bedtime., Disp: 30 tablet, Rfl: 5   promethazine -dextromethorphan (PROMETHAZINE -DM) 6.25-15 MG/5ML syrup, Take 5 mLs by mouth at bedtime as needed for cough., Disp: 120 mL, Rfl: 1   sertraline  (ZOLOFT ) 50 MG tablet, TAKE 1 TABLET BY MOUTH EVERY DAY, Disp: 90 tablet, Rfl: 1   SYNTHROID  75 MCG tablet, TAKE 1 TABLET BY MOUTH EVERY DAY, Disp: 90 tablet, Rfl: 1   Vitamin D , Ergocalciferol , 2000 units CAPS, Take 4 capsules by mouth daily., Disp: , Rfl:   Observations/Objective: Patient is well-developed, well-nourished in no acute distress.  Resting comfortably  at home.  Head is normocephalic, atraumatic.  No labored breathing.  Speech is clear and coherent with logical content.  Patient is alert and oriented at baseline.   Assessment and Plan:  Assessment and Plan    Right knee contusion Mild tenderness persists, but swelling resolved. Weight-bearing without difficulty. Symptoms improving, consistent with contusion. - Provide return to work note, cleared for Tuesday. - Leave note at front desk for son to pick up.        Follow Up Instructions: I discussed the assessment and treatment plan with the patient. The patient was provided an opportunity to ask questions and all were answered. The patient agreed with the plan and demonstrated an understanding of the instructions.  A copy of instructions were sent to the patient via MyChart unless otherwise noted below.   The patient was advised to call back or seek an in-person evaluation if  the symptoms worsen or if the condition fails to improve as anticipated.  [name], [place]

## 2024-06-01 ENCOUNTER — Encounter: Payer: Self-pay | Admitting: Physician Assistant

## 2024-06-02 NOTE — Progress Notes (Signed)
 Error, patient needing appt with PCP for Lorazepam  refill.   Mark erroneous.This encounter was created in error - please disregard.

## 2024-06-05 ENCOUNTER — Telehealth: Payer: Self-pay | Admitting: Physician Assistant

## 2024-06-05 ENCOUNTER — Encounter: Payer: Self-pay | Admitting: Physician Assistant

## 2024-06-05 VITALS — Ht 60.0 in | Wt 110.0 lb

## 2024-06-05 DIAGNOSIS — F419 Anxiety disorder, unspecified: Secondary | ICD-10-CM

## 2024-06-05 DIAGNOSIS — F5102 Adjustment insomnia: Secondary | ICD-10-CM

## 2024-06-05 DIAGNOSIS — E039 Hypothyroidism, unspecified: Secondary | ICD-10-CM

## 2024-06-05 MED ORDER — LORAZEPAM 1 MG PO TABS: 1.0000 mg | ORAL_TABLET | Freq: Every day | ORAL | 5 refills | Status: AC

## 2024-06-05 MED ORDER — SERTRALINE HCL 50 MG PO TABS: 50.0000 mg | ORAL_TABLET | Freq: Every day | ORAL | 5 refills | Status: AC

## 2024-06-05 NOTE — Progress Notes (Signed)
 "   Virtual Visit via Video Note   I, Jessica Juarez, connected with  Jessica Juarez  ([id number], [birth year]) on 06/05/2024 at  9:00 AM EST by a video-enabled telemedicine application and verified that I am speaking with the correct person using two identifiers.  Location: Patient: Home Provider: [place] office   I discussed the limitations of evaluation and management by telemedicine and the availability of in person appointments. The patient expressed understanding and agreed to proceed.    History of Present Illness: Jessica Juarez is a 54 y.o. who identifies as a female who was assigned female at birth, and is being seen today for medication.  Adjustment insomnia Taking ativan  1 mg nightly Tolerating well Reports there is no concerns with medication, no falls or excess sedation  Hypothyroidism Currently taking Synthroid  75 mcg daily Last TSH abnormal at 0.02 -- she has not come in for recheck Having unintentional weight loss  Reports there is no chest pain, shortness of breath, bowel changes  Anxiety Taking Zoloft  50 mg daily Tolerating well Reports there is no suicidal ideation/hi   Problems:  Patient Active Problem List   Diagnosis Date Noted   Vitamin D  deficiency 08/07/2017   Constipation 08/07/2017   Anxiety 08/07/2017   Situational depression 08/07/2017   Herpes simplex labialis 08/07/2017   Hypothyroidism 08/07/2017    Allergies: Allergies[1] Medications: Current Medications[2]  Observations/Objective: Patient is well-developed, well-nourished in no acute distress.  Resting comfortably  at home.  Head is normocephalic, atraumatic.  No labored breathing.  Speech is clear and coherent with logical content.  Patient is alert and oriented at baseline.   Assessment and Plan: Adjustment insomnia Well controlled She is aware of long-term risk of ativan  use Continue 1 mg nightly She is not willing to decrease dosage at this time FU in 6 month(s),  sooner if concerns  Hypothyroidism, unspecified type Update TSH and adjust Synthroid  dosage accordingly I advised that she is OVERDUE for cancer screenings and it is imperative that she have pap smear, colon cancer screening and mammogram to also rule out other causes of unintentional weight loss  Follow up based on results  Anxiety Well controlled Continue Zoloft  50 mg daily Reports there is no suicidal ideation/hi Follow up in 6 month(s), sooner if concerns   Follow Up Instructions: I discussed the assessment and treatment plan with the patient. The patient was provided an opportunity to ask questions and all were answered. The patient agreed with the plan and demonstrated an understanding of the instructions.  A copy of instructions were sent to the patient via MyChart unless otherwise noted below.   The patient was advised to call back or seek an in-person evaluation if the symptoms worsen or if the condition fails to improve as anticipated.  Jessica Buttner, PA    [1]  Allergies Allergen Reactions   Erythromycin Palpitations   Keflex [Cephalexin] Hives  [2]  Current Outpatient Medications:    acyclovir  (ZOVIRAX ) 400 MG tablet, TAKE 1 TABLET BY MOUTH 3 TIMES DAILY FOR 3 DAYS FOR FLARES AS NEEDED, Disp: 270 tablet, Rfl: 0   benzonatate  (TESSALON  PERLES) 100 MG capsule, Take 1 capsule (100 mg total) by mouth 3 (three) times daily as needed., Disp: 30 capsule, Rfl: 1   promethazine -dextromethorphan (PROMETHAZINE -DM) 6.25-15 MG/5ML syrup, Take 5 mLs by mouth at bedtime as needed for cough., Disp: 120 mL, Rfl: 1   SYNTHROID  75 MCG tablet, TAKE 1 TABLET BY MOUTH EVERY DAY, Disp: 90 tablet, Rfl: 1  Vitamin D , Ergocalciferol , 2000 units CAPS, Take 4 capsules by mouth daily., Disp: , Rfl:    LORazepam  (ATIVAN ) 1 MG tablet, Take 1 tablet (1 mg total) by mouth at bedtime., Disp: 30 tablet, Rfl: 5   sertraline  (ZOLOFT ) 50 MG tablet, Take 1 tablet (50 mg total) by mouth daily., Disp: 30  tablet, Rfl: 5  "

## 2024-06-05 NOTE — Patient Instructions (Signed)
 Wt Readings from Last 4 Encounters:  06/05/24 110 lb (49.9 kg)  06/12/23 112 lb (50.8 kg)  11/22/22 108 lb 6.1 oz (49.2 kg)  10/12/21 103 lb 12.8 oz (47.1 kg)

## 2024-06-09 ENCOUNTER — Other Ambulatory Visit: Payer: Self-pay

## 2024-06-10 ENCOUNTER — Other Ambulatory Visit: Payer: Self-pay

## 2024-06-11 ENCOUNTER — Other Ambulatory Visit: Payer: Self-pay

## 2024-07-02 ENCOUNTER — Other Ambulatory Visit: Payer: Self-pay | Admitting: Physician Assistant

## 2024-07-07 ENCOUNTER — Other Ambulatory Visit: Payer: Self-pay
